# Patient Record
Sex: Female | Born: 1985 | Race: White | Hispanic: No | Marital: Single | Smoking: Current every day smoker
Health system: Southern US, Community
[De-identification: ages and names within clinical notes are randomized; demographics above are authoritative.]

## PROBLEM LIST (undated history)

## (undated) ENCOUNTER — Inpatient Hospital Stay (HOSPITAL_COMMUNITY): Payer: Self-pay

## (undated) DIAGNOSIS — F319 Bipolar disorder, unspecified: Secondary | ICD-10-CM

## (undated) DIAGNOSIS — F32A Depression, unspecified: Secondary | ICD-10-CM

## (undated) DIAGNOSIS — F329 Major depressive disorder, single episode, unspecified: Secondary | ICD-10-CM

## (undated) DIAGNOSIS — F1010 Alcohol abuse, uncomplicated: Secondary | ICD-10-CM

## (undated) DIAGNOSIS — F1910 Other psychoactive substance abuse, uncomplicated: Secondary | ICD-10-CM

## (undated) HISTORY — PX: OTHER SURGICAL HISTORY: SHX169

---
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
## 2006-04-22 ENCOUNTER — Emergency Department (HOSPITAL_COMMUNITY): Admission: EM | Admit: 2006-04-22 | Discharge: 2006-04-22 | Payer: Self-pay | Admitting: Emergency Medicine

## 2006-08-07 ENCOUNTER — Emergency Department (HOSPITAL_COMMUNITY): Admission: EM | Admit: 2006-08-07 | Discharge: 2006-08-07 | Payer: Self-pay | Admitting: Emergency Medicine

## 2006-10-02 ENCOUNTER — Emergency Department (HOSPITAL_COMMUNITY): Admission: EM | Admit: 2006-10-02 | Discharge: 2006-10-02 | Payer: Self-pay | Admitting: Emergency Medicine

## 2006-12-12 ENCOUNTER — Emergency Department (HOSPITAL_COMMUNITY): Admission: EM | Admit: 2006-12-12 | Discharge: 2006-12-13 | Payer: Self-pay | Admitting: Emergency Medicine

## 2006-12-27 ENCOUNTER — Emergency Department (HOSPITAL_COMMUNITY): Admission: EM | Admit: 2006-12-27 | Discharge: 2006-12-27 | Payer: Self-pay | Admitting: Emergency Medicine

## 2009-01-14 ENCOUNTER — Emergency Department (HOSPITAL_COMMUNITY): Admission: EM | Admit: 2009-01-14 | Discharge: 2009-01-14 | Payer: Self-pay | Admitting: Emergency Medicine

## 2009-04-21 ENCOUNTER — Emergency Department: Payer: Self-pay | Admitting: Emergency Medicine

## 2009-12-15 ENCOUNTER — Inpatient Hospital Stay (HOSPITAL_COMMUNITY): Admission: EM | Admit: 2009-12-15 | Discharge: 2009-12-16 | Payer: Self-pay | Admitting: Emergency Medicine

## 2010-01-29 ENCOUNTER — Emergency Department (HOSPITAL_COMMUNITY): Admission: EM | Admit: 2010-01-29 | Discharge: 2010-01-29 | Payer: Self-pay | Admitting: Emergency Medicine

## 2010-04-10 ENCOUNTER — Emergency Department (HOSPITAL_COMMUNITY): Admission: EM | Admit: 2010-04-10 | Discharge: 2010-02-10 | Payer: Self-pay | Admitting: Emergency Medicine

## 2010-06-26 ENCOUNTER — Emergency Department (HOSPITAL_COMMUNITY)
Admission: EM | Admit: 2010-06-26 | Discharge: 2010-06-26 | Disposition: A | Discharge status: Home / Self Care | Payer: Self-pay | Attending: Emergency Medicine | Admitting: Emergency Medicine

## 2010-06-26 DIAGNOSIS — K029 Dental caries, unspecified: Secondary | ICD-10-CM | POA: Insufficient documentation

## 2010-06-26 DIAGNOSIS — K089 Disorder of teeth and supporting structures, unspecified: Secondary | ICD-10-CM | POA: Insufficient documentation

## 2010-06-28 ENCOUNTER — Emergency Department (HOSPITAL_COMMUNITY)
Admission: EM | Admit: 2010-06-28 | Discharge: 2010-06-28 | Disposition: A | Discharge status: Home / Self Care | Payer: Self-pay | Attending: Emergency Medicine | Admitting: Emergency Medicine

## 2010-06-28 DIAGNOSIS — K089 Disorder of teeth and supporting structures, unspecified: Secondary | ICD-10-CM | POA: Insufficient documentation

## 2010-06-28 DIAGNOSIS — K029 Dental caries, unspecified: Secondary | ICD-10-CM | POA: Insufficient documentation

## 2010-07-17 LAB — CBC
MCH: 31.3 pg (ref 26.0–34.0)
MCHC: 34.8 g/dL (ref 30.0–36.0)
MCV: 89.9 fL (ref 78.0–100.0)
RBC: 4.83 MIL/uL (ref 3.87–5.11)
WBC: 16.3 10*3/uL — ABNORMAL HIGH (ref 4.0–10.5)

## 2010-07-17 LAB — BASIC METABOLIC PANEL
Chloride: 108 mEq/L (ref 96–112)
Creatinine, Ser: 0.83 mg/dL (ref 0.4–1.2)
Glucose, Bld: 86 mg/dL (ref 70–99)
Potassium: 4.4 mEq/L (ref 3.5–5.1)
Sodium: 140 mEq/L (ref 135–145)

## 2010-07-17 LAB — DIFFERENTIAL
Basophils Absolute: 0 10*3/uL (ref 0.0–0.1)
Lymphocytes Relative: 15 % (ref 12–46)
Monocytes Relative: 10 % (ref 3–12)
Neutro Abs: 12.1 10*3/uL — ABNORMAL HIGH (ref 1.7–7.7)
Neutrophils Relative %: 74 % (ref 43–77)

## 2010-07-18 LAB — POCT PREGNANCY, URINE: Preg Test, Ur: NEGATIVE

## 2010-07-18 LAB — DIFFERENTIAL
Basophils Absolute: 0.1 10*3/uL (ref 0.0–0.1)
Eosinophils Absolute: 0.2 10*3/uL (ref 0.0–0.7)
Lymphs Abs: 2 10*3/uL (ref 0.7–4.0)
Monocytes Absolute: 0.8 10*3/uL (ref 0.1–1.0)
Monocytes Relative: 7 % (ref 3–12)
Neutro Abs: 8.2 10*3/uL — ABNORMAL HIGH (ref 1.7–7.7)

## 2010-07-18 LAB — COMPREHENSIVE METABOLIC PANEL
AST: 15 U/L (ref 0–37)
Albumin: 3.5 g/dL (ref 3.5–5.2)
Albumin: 3.8 g/dL (ref 3.5–5.2)
Albumin: 4 g/dL (ref 3.5–5.2)
Alkaline Phosphatase: 49 U/L (ref 39–117)
BUN: 13 mg/dL (ref 6–23)
CO2: 26 mEq/L (ref 19–32)
Calcium: 8.6 mg/dL (ref 8.4–10.5)
Calcium: 8.8 mg/dL (ref 8.4–10.5)
Chloride: 107 mEq/L (ref 96–112)
Chloride: 111 mEq/L (ref 96–112)
Creatinine, Ser: 0.69 mg/dL (ref 0.4–1.2)
Creatinine, Ser: 0.71 mg/dL (ref 0.4–1.2)
GFR calc Af Amer: >60 mL/min (ref >60)
Glucose, Bld: 91 mg/dL (ref 70–99)
Total Bilirubin: 0.5 mg/dL (ref 0.3–1.2)
Total Bilirubin: 0.6 mg/dL (ref 0.3–1.2)
Total Protein: 6.5 g/dL (ref 6.0–8.3)

## 2010-07-18 LAB — RAPID URINE DRUG SCREEN, HOSP PERFORMED
Amphetamines: NOT DETECTED
Barbiturates: NOT DETECTED
Benzodiazepines: POSITIVE — AB
Opiates: NOT DETECTED

## 2010-07-18 LAB — CULTURE, BLOOD (ROUTINE X 2)
Culture: NO GROWTH
Culture: NO GROWTH

## 2010-07-18 LAB — CBC
HCT: 40.5 % (ref 36.0–46.0)
MCH: 30.9 pg (ref 26.0–34.0)
MCH: 31.5 pg (ref 26.0–34.0)
MCHC: 34.1 g/dL (ref 30.0–36.0)
MCHC: 35.3 g/dL (ref 30.0–36.0)
MCV: 90.6 fL (ref 78.0–100.0)
Platelets: 197 10*3/uL (ref 150–400)
RBC: 4.57 MIL/uL (ref 3.87–5.11)
RBC: 4.79 MIL/uL (ref 3.87–5.11)
RDW: 15 % (ref 11.5–15.5)

## 2010-07-18 LAB — PROTIME-INR
INR: 1.03 (ref 0.00–1.49)
INR: 1.08 (ref 0.00–1.49)
Prothrombin Time: 13.7 seconds (ref 11.6–15.2)

## 2010-07-18 LAB — BASIC METABOLIC PANEL
BUN: 13 mg/dL (ref 6–23)
GFR calc non Af Amer: >60 mL/min (ref >60)
Glucose, Bld: 78 mg/dL (ref 70–99)

## 2010-07-18 LAB — URINALYSIS, ROUTINE W REFLEX MICROSCOPIC
Bilirubin Urine: NEGATIVE
Glucose, UA: NEGATIVE mg/dL
Hgb urine dipstick: NEGATIVE
Ketones, ur: NEGATIVE mg/dL
Protein, ur: NEGATIVE mg/dL
Specific Gravity, Urine: 1.006 (ref 1.005–1.030)
Urobilinogen, UA: 0.2 mg/dL (ref 0.0–1.0)
pH: 5.5 (ref 5.0–8.0)

## 2010-07-18 LAB — URINE CULTURE
Colony Count: NO GROWTH
Culture: NO GROWTH

## 2010-07-18 LAB — SALICYLATE LEVEL: Salicylate Lvl: <4 mg/dL (ref 2.8–20.0)

## 2010-07-18 LAB — ACETAMINOPHEN LEVEL
Acetaminophen (Tylenol), Serum: 102.2 ug/mL — ABNORMAL HIGH (ref 10–30)
Acetaminophen (Tylenol), Serum: 23 ug/mL (ref 10–30)
Acetaminophen (Tylenol), Serum: <10 ug/mL — ABNORMAL LOW (ref 10–30)

## 2010-08-08 LAB — POCT URINALYSIS DIP (DEVICE)
Bilirubin Urine: NEGATIVE
Ketones, ur: NEGATIVE mg/dL
Nitrite: NEGATIVE
Protein, ur: NEGATIVE mg/dL
pH: 6 (ref 5.0–8.0)

## 2010-08-08 LAB — GC/CHLAMYDIA PROBE AMP, GENITAL: Chlamydia, DNA Probe: NEGATIVE

## 2010-12-20 ENCOUNTER — Emergency Department (HOSPITAL_COMMUNITY): Payer: Self-pay

## 2010-12-20 ENCOUNTER — Emergency Department (HOSPITAL_COMMUNITY)
Admission: EM | Admit: 2010-12-20 | Discharge: 2010-12-21 | Disposition: A | Discharge status: Home / Self Care | Payer: Self-pay | Attending: Emergency Medicine | Admitting: Emergency Medicine

## 2010-12-20 DIAGNOSIS — M545 Low back pain, unspecified: Secondary | ICD-10-CM | POA: Insufficient documentation

## 2010-12-20 DIAGNOSIS — M25569 Pain in unspecified knee: Secondary | ICD-10-CM | POA: Insufficient documentation

## 2010-12-20 DIAGNOSIS — R040 Epistaxis: Secondary | ICD-10-CM | POA: Insufficient documentation

## 2010-12-20 DIAGNOSIS — IMO0002 Reserved for concepts with insufficient information to code with codable children: Secondary | ICD-10-CM | POA: Insufficient documentation

## 2010-12-20 DIAGNOSIS — S0003XA Contusion of scalp, initial encounter: Secondary | ICD-10-CM | POA: Insufficient documentation

## 2010-12-20 DIAGNOSIS — R51 Headache: Secondary | ICD-10-CM | POA: Insufficient documentation

## 2011-02-16 LAB — DIFFERENTIAL
Basophils Absolute: 0
Basophils Relative: 0
Eosinophils Absolute: 0.1
Eosinophils Relative: 1
Lymphs Abs: 3.1
Neutrophils Relative %: 69

## 2011-02-16 LAB — CBC
HCT: 38.7
MCHC: 34.1
MCV: 88.1
Platelets: 191
RDW: 13.5

## 2011-02-16 LAB — BASIC METABOLIC PANEL
BUN: 2 — ABNORMAL LOW
Chloride: 110
Creatinine, Ser: 0.75
Glucose, Bld: 92
Potassium: 3.5

## 2011-02-16 LAB — RAPID URINE DRUG SCREEN, HOSP PERFORMED
Amphetamines: NOT DETECTED
Barbiturates: NOT DETECTED
Benzodiazepines: NOT DETECTED
Cocaine: NOT DETECTED

## 2012-02-17 ENCOUNTER — Emergency Department: Payer: Self-pay | Admitting: *Deleted

## 2012-07-11 ENCOUNTER — Emergency Department (HOSPITAL_COMMUNITY)
Admission: EM | Admit: 2012-07-11 | Discharge: 2012-07-11 | Disposition: A | Discharge status: Home / Self Care | Payer: Self-pay | Attending: Emergency Medicine | Admitting: Emergency Medicine

## 2012-07-11 ENCOUNTER — Encounter (HOSPITAL_COMMUNITY): Payer: Self-pay | Admitting: Emergency Medicine

## 2012-07-11 DIAGNOSIS — F17200 Nicotine dependence, unspecified, uncomplicated: Secondary | ICD-10-CM | POA: Insufficient documentation

## 2012-07-11 DIAGNOSIS — B86 Scabies: Secondary | ICD-10-CM

## 2012-07-11 MED ORDER — PERMETHRIN 5 % EX CREA: TOPICAL_CREAM | CUTANEOUS | Status: DC

## 2012-07-11 MED ORDER — HYDROXYZINE HCL 25 MG PO TABS: 25.0000 mg | ORAL_TABLET | Freq: Four times a day (QID) | ORAL | Status: DC

## 2012-07-11 NOTE — ED Notes (Signed)
MD at bedside. 

## 2012-07-11 NOTE — ED Provider Notes (Signed)
History    This chart was scribed for non-physician practitioner working with Joanna Co, MD by ED Scribe, Burman Nieves. This patient was seen in room WTR6/WTR6 and the patient's care was started at 3:42 PM.   CSN: [id number]  Arrival date & time 07/11/12  1523   First MD Initiated Contact with Patient 07/11/12 1542      Chief Complaint  Patient presents with  . Rash    (Consider location/radiation/quality/duration/timing/severity/associated sxs/prior treatment) Patient is a 27 y.o. female presenting with rash. The history is provided by the patient. No language interpreter was used.  Rash Location:  Full body Quality: itchiness   Severity:  Moderate Timing:  Constant Chronicity:  Recurrent Relieved by:  Nothing Associated symptoms: no fever, no throat swelling and no tongue swelling    Joanna Deleon is a 27 y.o. female who presents to the Emergency Department complaining of moderate constant irritation due to papular rash all over her body onset a month ago. Pt reports that the rash has a very itchy sensation all over her body. She says she had stayed in a hotel with her boyfriend where she may have picked up the rash from.  Pt is not having any trouble breathing or swallowing. Pt has tried anti-itch cream and Hydrocortisone with no relief. Boyfriend has rash as well. Pt denies fever, chills, cough, nausea, vomiting, diarrhea, SOB, weakness, and any other associated symptoms. Pt has no chronic medical hx.  She denies use of any new medications, lotions, soaps, or detergents.    History reviewed. No pertinent past surgical history.  No family history on file.  History  Substance Use Topics  . Smoking status: Current Every Day Smoker -- 1.00 packs/day    Types: Cigarettes  . Smokeless tobacco: Not on file  . Alcohol Use: No     Comment: rarely    OB History   Grav Para Term Preterm Abortions TAB SAB Ect Mult Living                  Review of Systems  Constitutional:  Negative for fever and chills.  Skin: Positive for rash (itchy rash).  All other systems reviewed and are negative.    Allergies  Review of patient's allergies indicates no known allergies.  Home Medications  No current outpatient prescriptions on file.  BP 113/61  Pulse 81  Temp(Src) 98.3 F (36.8 C) (Oral)  Resp 18  SpO2 98%  LMP 06/20/2012  Physical Exam  Nursing note and vitals reviewed. Constitutional: She appears well-developed and well-nourished.  HENT:  Head: Normocephalic and atraumatic.  Mouth/Throat: Oropharynx is clear and moist. No oropharyngeal exudate.  Eyes: EOM are normal. Pupils are equal, round, and reactive to light.  Neck: Normal range of motion. Neck supple.  Cardiovascular: Normal rate, regular rhythm and normal heart sounds.   Pulmonary/Chest: Effort normal and breath sounds normal. She has no wheezes.  Musculoskeletal: Normal range of motion.  Neurological: She is alert.  Skin: Skin is warm and dry. Rash noted. There is erythema.  Erythemas papular rash located on abdomen, back, webbed spaces of fingers , chest, forearms, elbows , and between pt's toes.  Psychiatric: She has a normal mood and affect. Her behavior is normal.    ED Course  Procedures (including critical care time) DIAGNOSTIC STUDIES: Oxygen Saturation is 98% on room air, normal by my interpretation.    COORDINATION OF CARE:  4:43 PM Discussed ED treatment with pt and pt agrees.  Labs Reviewed - No data to display No results found.   No diagnosis found.    MDM  Appearance and distribution of rash consistent with Scabies.  Patient given prescription for Permethrin Cream.    I personally performed the services described in this documentation, which was scribed in my presence. The recorded information has been reviewed and is accurate.    Pascal [place], PA-C 07/11/12 1807

## 2012-07-11 NOTE — ED Notes (Signed)
Pt presenting to ed with c/o rash to extremities x 1 month with itching

## 2012-07-12 NOTE — ED Provider Notes (Signed)
Medical screening examination/treatment/procedure(s) were performed by non-physician practitioner and as supervising physician I was immediately available for consultation/collaboration.   [name], MD 07/12/12 [phone]

## 2012-11-28 ENCOUNTER — Encounter (HOSPITAL_COMMUNITY): Payer: Self-pay | Admitting: Cardiology

## 2012-11-28 ENCOUNTER — Emergency Department (HOSPITAL_COMMUNITY)
Admission: EM | Admit: 2012-11-28 | Discharge: 2012-11-28 | Disposition: A | Discharge status: Home / Self Care | Payer: Self-pay | Attending: Emergency Medicine | Admitting: Emergency Medicine

## 2012-11-28 ENCOUNTER — Emergency Department (HOSPITAL_COMMUNITY): Payer: Self-pay

## 2012-11-28 DIAGNOSIS — F17200 Nicotine dependence, unspecified, uncomplicated: Secondary | ICD-10-CM | POA: Insufficient documentation

## 2012-11-28 DIAGNOSIS — R1013 Epigastric pain: Secondary | ICD-10-CM | POA: Insufficient documentation

## 2012-11-28 DIAGNOSIS — M545 Low back pain, unspecified: Secondary | ICD-10-CM | POA: Insufficient documentation

## 2012-11-28 DIAGNOSIS — R748 Abnormal levels of other serum enzymes: Secondary | ICD-10-CM | POA: Insufficient documentation

## 2012-11-28 DIAGNOSIS — Z3202 Encounter for pregnancy test, result negative: Secondary | ICD-10-CM | POA: Insufficient documentation

## 2012-11-28 DIAGNOSIS — R109 Unspecified abdominal pain: Secondary | ICD-10-CM

## 2012-11-28 DIAGNOSIS — R6883 Chills (without fever): Secondary | ICD-10-CM | POA: Insufficient documentation

## 2012-11-28 DIAGNOSIS — R112 Nausea with vomiting, unspecified: Secondary | ICD-10-CM | POA: Insufficient documentation

## 2012-11-28 LAB — LIPASE, BLOOD: Lipase: 26 U/L (ref 11–59)

## 2012-11-28 LAB — CBC WITH DIFFERENTIAL/PLATELET
Basophils Absolute: 0 10*3/uL (ref 0.0–0.1)
Basophils Relative: 0 % (ref 0–1)
Eosinophils Relative: 1 % (ref 0–5)
HCT: 44.6 % (ref 36.0–46.0)
Hemoglobin: 15.4 g/dL — ABNORMAL HIGH (ref 12.0–15.0)
MCH: 30.9 pg (ref 26.0–34.0)
MCHC: 34.5 g/dL (ref 30.0–36.0)
MCV: 89.4 fL (ref 78.0–100.0)
Monocytes Absolute: 1 10*3/uL (ref 0.1–1.0)
Monocytes Relative: 10 % (ref 3–12)
Neutro Abs: 6.8 10*3/uL (ref 1.7–7.7)
RDW: 13.8 % (ref 11.5–15.5)

## 2012-11-28 LAB — COMPREHENSIVE METABOLIC PANEL
Albumin: 3.7 g/dL (ref 3.5–5.2)
BUN: 13 mg/dL (ref 6–23)
CO2: 33 mEq/L — ABNORMAL HIGH (ref 19–32)
Calcium: 9.7 mg/dL (ref 8.4–10.5)
Chloride: 96 mEq/L (ref 96–112)
Creatinine, Ser: 0.82 mg/dL (ref 0.50–1.10)
GFR calc non Af Amer: >90 mL/min (ref >90)
Total Bilirubin: 0.9 mg/dL (ref 0.3–1.2)

## 2012-11-28 LAB — URINALYSIS, ROUTINE W REFLEX MICROSCOPIC
Ketones, ur: 15 mg/dL — AB
Nitrite: NEGATIVE
Protein, ur: 30 mg/dL — AB

## 2012-11-28 LAB — POCT PREGNANCY, URINE: Preg Test, Ur: NEGATIVE

## 2012-11-28 MED ORDER — SODIUM CHLORIDE 0.9 % IV BOLUS (SEPSIS)
1000.0000 mL | Freq: Once | INTRAVENOUS | Status: AC
  Administered 2012-11-28: 1000 mL via INTRAVENOUS

## 2012-11-28 MED ORDER — KETOROLAC TROMETHAMINE 30 MG/ML IJ SOLN
30.0000 mg | Freq: Once | INTRAMUSCULAR | Status: AC
  Administered 2012-11-28: 30 mg via INTRAVENOUS
  Filled 2012-11-28: qty 1

## 2012-11-28 MED ORDER — ONDANSETRON HCL 4 MG/2ML IJ SOLN
4.0000 mg | Freq: Once | INTRAMUSCULAR | Status: AC
  Administered 2012-11-28: 4 mg via INTRAVENOUS
  Filled 2012-11-28: qty 2

## 2012-11-28 MED ORDER — ONDANSETRON HCL 4 MG PO TABS: 4.0000 mg | ORAL_TABLET | Freq: Four times a day (QID) | ORAL | Status: DC

## 2012-11-28 MED ORDER — PANTOPRAZOLE SODIUM 40 MG IV SOLR
40.0000 mg | Freq: Once | INTRAVENOUS | Status: AC
  Administered 2012-11-28: 40 mg via INTRAVENOUS
  Filled 2012-11-28: qty 40

## 2012-11-28 NOTE — ED Provider Notes (Signed)
CSN: [id number]     Arrival date & time 11/28/12  1038 History     First MD Initiated Contact with Patient 11/28/12 1156     Chief Complaint  Patient presents with  . Abdominal Pain  . Emesis   (Consider location/radiation/quality/duration/timing/severity/associated sxs/prior Treatment) HPI Comments: 27 year old female no significant past medical history presents emergency department complaining of gradual onset midepigastric abdominal pain x2 days. Pain described as sharp, intermittent, occasionally radiating to her back. Pain episodes last about 5 minutes, nothing in specific makes him, though. She has not had any alleviating factors. Admits to associated cold chills, nausea and vomiting, vomiting worsening today when she began to vomit so much that she vomited a small amount of blood. Denies fever, increased urinary frequency, urgency or dysuria, vaginal or pelvic pain, vaginal discharge. She is currently on her menstrual cycle and is normal. Admits to eating a fried and fatty food from [employer] 2 days back, also had alcohol at that time. States she used to drink "a lot more than she does now". Current everyday smoker. No hx of abdominal surgeries.  Patient is a 27 y.o. female presenting with abdominal pain and vomiting. The history is provided by the patient.  Abdominal Pain Associated symptoms include abdominal pain, chills, nausea and vomiting. Pertinent negatives include no fever.  Emesis Associated symptoms: abdominal pain and chills   Associated symptoms: no diarrhea     History reviewed. No pertinent past medical history. History reviewed. No pertinent past surgical history. History reviewed. No pertinent family history. History  Substance Use Topics  . Smoking status: Current Every Day Smoker    Types: Cigarettes  . Smokeless tobacco: Not on file  . Alcohol Use: Yes   OB History   Grav Para Term Preterm Abortions TAB SAB Ect Mult Living                 Review of  Systems  Constitutional: Positive for chills and appetite change. Negative for fever.  Gastrointestinal: Positive for nausea, vomiting and abdominal pain. Negative for diarrhea.  Genitourinary: Negative for dysuria, flank pain, vaginal discharge, difficulty urinating, vaginal pain, menstrual problem and pelvic pain.  Musculoskeletal: Positive for back pain.  All other systems reviewed and are negative.    Allergies  Review of patient's allergies indicates no known allergies.  Home Medications  No current outpatient prescriptions on file. BP 115/62  Pulse 58  Temp(Src) 98 F (36.7 C) (Oral)  Resp 16  Ht 5\' 4"  (1.626 m)  Wt 122 lb (55.339 kg)  BMI 20.93 kg/m2  SpO2 97%  LMP 10/31/2012 Physical Exam  Nursing note and vitals reviewed. Constitutional: She is oriented to person, place, and time. She appears well-developed and well-nourished. No distress.  HENT:  Head: Normocephalic and atraumatic.  Mouth/Throat: Oropharynx is clear and moist.  Eyes: Conjunctivae are normal. No scleral icterus.  Neck: Normal range of motion. Neck supple.  Cardiovascular: Normal rate, regular rhythm and normal heart sounds.   Pulmonary/Chest: Effort normal and breath sounds normal.  Abdominal: Soft. Normal appearance and bowel sounds are normal. She exhibits no distension and no mass. There is tenderness in the epigastric area. There is no rigidity, no rebound, no guarding, no CVA tenderness, no tenderness at McBurney's point and negative Murphy's sign.  No peritoneal signs.  Musculoskeletal: Normal range of motion. She exhibits no edema.  Neurological: She is alert and oriented to person, place, and time.  Skin: Skin is warm and dry. She is not diaphoretic.  Psychiatric:  She has a normal mood and affect. Her behavior is normal.    ED Course   Procedures (including critical care time)  Labs Reviewed  CBC WITH DIFFERENTIAL - Abnormal; Notable for the following:    Hemoglobin 15.4 (*)    All  other components within normal limits  COMPREHENSIVE METABOLIC PANEL - Abnormal; Notable for the following:    CO2 33 (*)    Glucose, Bld 103 (*)    AST 161 (*)    ALT 249 (*)    Alkaline Phosphatase 134 (*)    All other components within normal limits  URINALYSIS, ROUTINE W REFLEX MICROSCOPIC - Abnormal; Notable for the following:    Color, Urine AMBER (*)    APPearance HAZY (*)    Hgb urine dipstick LARGE (*)    Bilirubin Urine SMALL (*)    Ketones, ur 15 (*)    Protein, ur 30 (*)    Leukocytes, UA SMALL (*)    All other components within normal limits  URINE MICROSCOPIC-ADD ON - Abnormal; Notable for the following:    Squamous Epithelial / LPF FEW (*)    Bacteria, UA FEW (*)    All other components within normal limits  URINE CULTURE  LIPASE, BLOOD  POCT PREGNANCY, URINE   US Abdomen Complete  11/28/2012   *RADIOLOGY REPORT*  Clinical Data:  Epigastric pain, elevated LFTs  COMPLETE ABDOMINAL ULTRASOUND  Comparison:  None.  Findings:  Gallbladder:  No gallstones, gallbladder wall thickening, or pericholecystic fluid. No sonographic Murphy's sign.  Common bile duct:  Measures 2.1 mm in diameter within normal limits.  Liver:  No focal lesion identified.  Within normal limits in parenchymal echogenicity.  IVC:  Appears normal.  Pancreas:  No focal abnormality seen.  Spleen:  Measures 9.3 cm in length.  Normal echogenicity.  Right Kidney:  Measures 10.7 cm in length.  No mass, hydronephrosis or diagnostic renal calculus  Left Kidney:  Measures 10.9 cm in length.  No mass, hydronephrosis or diagnostic renal calculus  Abdominal aorta:  No aneurysm identified. Measures up to 1.9 cm in diameter.  IMPRESSION: Negative abdominal ultrasound.   Original Report Authenticated By: Natasha Mead, M.D.   1. Abdominal pain   2. Elevated liver enzymes     MDM  Patient with mid-epigastric abdominal pain, n/v. She is afebrile, NAD, normal vital signs. Gastritis vs gallbladder disease vs pancreatitis.  Admits to drinking, poor diet. Labs, cbc, cmp, lipase, ua, urine preg. Fluids, zofran, toradol. 12:25 PM Elevated LFTs- AST 161, ALT 249, ask phos 134. No leukocytosis. Lipase normal. Will get abdominal ultrasound. 2:16 PM Negative abdominal US. Nausea has subsided. Still with mild abdominal pain, but is relating it to being hungry. Will give PO challenge. 2:41 PM Patient tolerated PO challenge. Abdominal pain has subsided, she is feeling better. Stable for discharge, f/u with GI, resources given for PCP. Advised against fatty/fried foods, alcohol. Rx for zofran given. Return precautions discussed. Patient states understanding of plan and is agreeable.   Trevor Mace, PA-C 11/28/12 1444

## 2012-11-28 NOTE — ED Notes (Signed)
NO answer x 2

## 2012-11-28 NOTE — ED Notes (Signed)
No answer x2 

## 2012-11-28 NOTE — ED Notes (Signed)
Pt reports that she started having abd pain and n/v that started 2 days ago. Reports that she also vomited blood this morning. Denies any urinary symptoms but denies report some lower back pain.

## 2012-11-28 NOTE — ED Provider Notes (Signed)
  Medical screening examination/treatment/procedure(s) were performed by non-physician practitioner and as supervising physician I was immediately available for consultation/collaboration.   Keymani Glynn, MD 11/28/12 1659 

## 2012-11-29 ENCOUNTER — Encounter (HOSPITAL_COMMUNITY): Payer: Self-pay | Admitting: Emergency Medicine

## 2012-11-29 LAB — URINE CULTURE: Colony Count: 65000

## 2012-12-04 ENCOUNTER — Encounter (HOSPITAL_COMMUNITY): Payer: Self-pay | Admitting: *Deleted

## 2012-12-04 ENCOUNTER — Emergency Department (HOSPITAL_COMMUNITY)
Admission: EM | Admit: 2012-12-04 | Discharge: 2012-12-04 | Disposition: A | Discharge status: Home / Self Care | Payer: Self-pay | Attending: Emergency Medicine | Admitting: Emergency Medicine

## 2012-12-04 DIAGNOSIS — Z008 Encounter for other general examination: Secondary | ICD-10-CM | POA: Insufficient documentation

## 2012-12-04 DIAGNOSIS — Z7289 Other problems related to lifestyle: Secondary | ICD-10-CM

## 2012-12-04 DIAGNOSIS — Y939 Activity, unspecified: Secondary | ICD-10-CM | POA: Insufficient documentation

## 2012-12-04 DIAGNOSIS — Z5900 Homelessness unspecified: Secondary | ICD-10-CM

## 2012-12-04 DIAGNOSIS — Y929 Unspecified place or not applicable: Secondary | ICD-10-CM | POA: Insufficient documentation

## 2012-12-04 DIAGNOSIS — F17200 Nicotine dependence, unspecified, uncomplicated: Secondary | ICD-10-CM | POA: Insufficient documentation

## 2012-12-04 DIAGNOSIS — Z8659 Personal history of other mental and behavioral disorders: Secondary | ICD-10-CM | POA: Insufficient documentation

## 2012-12-04 DIAGNOSIS — W268XXA Contact with other sharp object(s), not elsewhere classified, initial encounter: Secondary | ICD-10-CM | POA: Insufficient documentation

## 2012-12-04 DIAGNOSIS — F32A Depression, unspecified: Secondary | ICD-10-CM

## 2012-12-04 DIAGNOSIS — S61509A Unspecified open wound of unspecified wrist, initial encounter: Secondary | ICD-10-CM | POA: Insufficient documentation

## 2012-12-04 DIAGNOSIS — F329 Major depressive disorder, single episode, unspecified: Secondary | ICD-10-CM

## 2012-12-04 DIAGNOSIS — IMO0002 Reserved for concepts with insufficient information to code with codable children: Secondary | ICD-10-CM

## 2012-12-04 HISTORY — DX: Major depressive disorder, single episode, unspecified: F32.9

## 2012-12-04 HISTORY — DX: Depression, unspecified: F32.A

## 2012-12-04 HISTORY — DX: Bipolar disorder, unspecified: F31.9

## 2012-12-04 MED ORDER — TETANUS-DIPHTH-ACELL PERTUSSIS 5-2.5-18.5 LF-MCG/0.5 IM SUSP
0.5000 mL | Freq: Once | INTRAMUSCULAR | Status: AC
  Administered 2012-12-04: 0.5 mL via INTRAMUSCULAR
  Filled 2012-12-04: qty 0.5
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
## 2012-12-08 NOTE — ED Provider Notes (Signed)
Medical screening examination/treatment/procedure(s) were performed by non-physician practitioner and as supervising physician I was immediately available for consultation/collaboration.   Ross Bender, MD 12/08/12 1444 

## 2012-12-13 NOTE — ED Provider Notes (Signed)
Medical screening examination/treatment/procedure(s) were performed by non-physician practitioner and as supervising physician I was immediately available for consultation/collaboration.   Rolan Bucco, MD 12/13/12 2238

## 2013-02-23 ENCOUNTER — Inpatient Hospital Stay (HOSPITAL_COMMUNITY)
Admission: EM | Admit: 2013-02-23 | Discharge: 2013-02-26 | DRG: 897 | Disposition: A | Discharge status: Other Acute Inpatient Hospital | Payer: Federal, State, Local not specified - Other | Attending: Internal Medicine | Admitting: Internal Medicine

## 2013-02-23 ENCOUNTER — Encounter (HOSPITAL_COMMUNITY): Payer: Self-pay | Admitting: Emergency Medicine

## 2013-02-23 DIAGNOSIS — F1920 Other psychoactive substance dependence, uncomplicated: Secondary | ICD-10-CM

## 2013-02-23 DIAGNOSIS — IMO0002 Reserved for concepts with insufficient information to code with codable children: Secondary | ICD-10-CM | POA: Diagnosis present

## 2013-02-23 DIAGNOSIS — F17200 Nicotine dependence, unspecified, uncomplicated: Secondary | ICD-10-CM | POA: Diagnosis present

## 2013-02-23 DIAGNOSIS — F329 Major depressive disorder, single episode, unspecified: Secondary | ICD-10-CM | POA: Diagnosis present

## 2013-02-23 DIAGNOSIS — F319 Bipolar disorder, unspecified: Secondary | ICD-10-CM | POA: Diagnosis present

## 2013-02-23 DIAGNOSIS — F1910 Other psychoactive substance abuse, uncomplicated: Secondary | ICD-10-CM | POA: Diagnosis present

## 2013-02-23 DIAGNOSIS — F10939 Alcohol use, unspecified with withdrawal, unspecified: Principal | ICD-10-CM | POA: Diagnosis present

## 2013-02-23 DIAGNOSIS — F10239 Alcohol dependence with withdrawal, unspecified: Principal | ICD-10-CM | POA: Diagnosis present

## 2013-02-23 DIAGNOSIS — R000 Tachycardia, unspecified: Secondary | ICD-10-CM | POA: Diagnosis present

## 2013-02-23 DIAGNOSIS — F1010 Alcohol abuse, uncomplicated: Secondary | ICD-10-CM

## 2013-02-23 DIAGNOSIS — F1210 Cannabis abuse, uncomplicated: Secondary | ICD-10-CM

## 2013-02-23 DIAGNOSIS — F1410 Cocaine abuse, uncomplicated: Secondary | ICD-10-CM | POA: Diagnosis present

## 2013-02-23 DIAGNOSIS — F1020 Alcohol dependence, uncomplicated: Secondary | ICD-10-CM | POA: Diagnosis present

## 2013-02-23 DIAGNOSIS — D72829 Elevated white blood cell count, unspecified: Secondary | ICD-10-CM | POA: Diagnosis present

## 2013-02-23 DIAGNOSIS — F1510 Other stimulant abuse, uncomplicated: Secondary | ICD-10-CM

## 2013-02-23 DIAGNOSIS — R7881 Bacteremia: Secondary | ICD-10-CM

## 2013-02-23 LAB — CBC
Hemoglobin: 15.1 g/dL — ABNORMAL HIGH (ref 12.0–15.0)
MCH: 31.6 pg (ref 26.0–34.0)
MCHC: 34.8 g/dL (ref 30.0–36.0)
Platelets: 234 10*3/uL (ref 150–400)
RBC: 4.78 MIL/uL (ref 3.87–5.11)
RDW: 13.1 % (ref 11.5–15.5)
WBC: 12 10*3/uL — ABNORMAL HIGH (ref 4.0–10.5)

## 2013-02-23 LAB — URINE MICROSCOPIC-ADD ON

## 2013-02-23 LAB — URINALYSIS, ROUTINE W REFLEX MICROSCOPIC
Hgb urine dipstick: NEGATIVE
Ketones, ur: 15 mg/dL — AB
Leukocytes, UA: NEGATIVE
Protein, ur: 30 mg/dL — AB
Urobilinogen, UA: 1 mg/dL (ref 0.0–1.0)

## 2013-02-23 LAB — COMPREHENSIVE METABOLIC PANEL
ALT: 104 U/L — ABNORMAL HIGH (ref 0–35)
Albumin: 4.4 g/dL (ref 3.5–5.2)
Alkaline Phosphatase: 93 U/L (ref 39–117)
CO2: 28 mEq/L (ref 19–32)
Calcium: 10 mg/dL (ref 8.4–10.5)
Chloride: 96 mEq/L (ref 96–112)
Creatinine, Ser: 0.68 mg/dL (ref 0.50–1.10)
GFR calc Af Amer: >90 mL/min (ref >90)
GFR calc non Af Amer: >90 mL/min (ref >90)
Glucose, Bld: 111 mg/dL — ABNORMAL HIGH (ref 70–99)
Potassium: 4.3 mEq/L (ref 3.5–5.1)
Sodium: 136 mEq/L (ref 135–145)
Total Bilirubin: 0.5 mg/dL (ref 0.3–1.2)

## 2013-02-23 LAB — PREGNANCY, URINE: Preg Test, Ur: NEGATIVE

## 2013-02-23 LAB — RAPID URINE DRUG SCREEN, HOSP PERFORMED
Amphetamines: POSITIVE — AB
Barbiturates: NOT DETECTED
Benzodiazepines: NOT DETECTED
Cocaine: NOT DETECTED
Tetrahydrocannabinol: POSITIVE — AB

## 2013-02-23 LAB — TROPONIN I: Troponin I: <0.3 ng/mL (ref <0.30)

## 2013-02-23 LAB — HCG, QUANTITATIVE, PREGNANCY: hCG, Beta Chain, Quant, S: <1 m[IU]/mL (ref <5)

## 2013-02-23 LAB — ACETAMINOPHEN LEVEL: Acetaminophen (Tylenol), Serum: <15 ug/mL (ref 10–30)

## 2013-02-23 MED ORDER — FOLIC ACID 1 MG PO TABS
1.0000 mg | ORAL_TABLET | Freq: Every day | ORAL | Status: DC
  Administered 2013-02-24 – 2013-02-26 (×3): 1 mg via ORAL
  Filled 2013-02-23 (×3): qty 1

## 2013-02-23 MED ORDER — SODIUM CHLORIDE 0.9 % IV BOLUS (SEPSIS)
1000.0000 mL | Freq: Once | INTRAVENOUS | Status: AC
  Administered 2013-02-23: 1000 mL via INTRAVENOUS

## 2013-02-23 MED ORDER — ONDANSETRON HCL 4 MG/2ML IJ SOLN
4.0000 mg | Freq: Once | INTRAMUSCULAR | Status: AC
  Administered 2013-02-23: 4 mg via INTRAVENOUS
  Filled 2013-02-23: qty 2

## 2013-02-23 MED ORDER — ONDANSETRON HCL 4 MG PO TABS
4.0000 mg | ORAL_TABLET | Freq: Four times a day (QID) | ORAL | Status: DC | PRN
  Administered 2013-02-23: 4 mg via ORAL
  Filled 2013-02-23: qty 1

## 2013-02-23 MED ORDER — LORAZEPAM 1 MG PO TABS
1.0000 mg | ORAL_TABLET | Freq: Four times a day (QID) | ORAL | Status: DC | PRN
  Administered 2013-02-23 – 2013-02-25 (×6): 1 mg via ORAL
  Filled 2013-02-23 (×5): qty 1

## 2013-02-23 MED ORDER — ADULT MULTIVITAMIN W/MINERALS CH: 1.0000 | ORAL_TABLET | Freq: Every day | ORAL | Status: DC

## 2013-02-23 MED ORDER — LORAZEPAM 1 MG PO TABS: 2.0000 mg | ORAL_TABLET | ORAL | Status: DC | PRN

## 2013-02-23 MED ORDER — VITAMIN B-1 100 MG PO TABS
100.0000 mg | ORAL_TABLET | Freq: Every day | ORAL | Status: DC
  Administered 2013-02-24 – 2013-02-26 (×3): 100 mg via ORAL
  Filled 2013-02-23 (×4): qty 1

## 2013-02-23 MED ORDER — LORAZEPAM 2 MG/ML IJ SOLN
1.0000 mg | Freq: Once | INTRAMUSCULAR | Status: AC
  Administered 2013-02-23: 1 mg via INTRAVENOUS
  Filled 2013-02-23: qty 1

## 2013-02-23 MED ORDER — HEPARIN SODIUM (PORCINE) 5000 UNIT/ML IJ SOLN
5000.0000 [IU] | Freq: Three times a day (TID) | INTRAMUSCULAR | Status: DC
  Administered 2013-02-23 – 2013-02-26 (×8): 5000 [IU] via SUBCUTANEOUS
  Filled 2013-02-23 (×11): qty 1

## 2013-02-23 MED ORDER — SODIUM CHLORIDE 0.9 % IJ SOLN: 3.0000 mL | Freq: Two times a day (BID) | INTRAMUSCULAR | Status: DC

## 2013-02-23 MED ORDER — FOLIC ACID 1 MG PO TABS: 1.0000 mg | ORAL_TABLET | Freq: Every day | ORAL | Status: DC

## 2013-02-23 MED ORDER — LORAZEPAM 2 MG/ML IJ SOLN
2.0000 mg | Freq: Once | INTRAMUSCULAR | Status: AC
  Administered 2013-02-23: 2 mg via INTRAVENOUS
  Filled 2013-02-23: qty 1

## 2013-02-23 MED ORDER — SODIUM CHLORIDE 0.9 % IV SOLN: INTRAVENOUS | Status: DC

## 2013-02-23 MED ORDER — THIAMINE HCL 100 MG/ML IJ SOLN
Freq: Once | INTRAVENOUS | Status: AC
  Administered 2013-02-23: 21:00:00 via INTRAVENOUS
  Filled 2013-02-23: qty 1000

## 2013-02-23 MED ORDER — THIAMINE HCL 100 MG/ML IJ SOLN: 100.0000 mg | Freq: Every day | INTRAMUSCULAR | Status: DC

## 2013-02-23 MED ORDER — LORAZEPAM 1 MG PO TABS
1.0000 mg | ORAL_TABLET | Freq: Once | ORAL | Status: AC
  Administered 2013-02-23: 1 mg via ORAL
  Filled 2013-02-23: qty 1

## 2013-02-23 MED ORDER — ONDANSETRON HCL 4 MG/2ML IJ SOLN: 4.0000 mg | Freq: Four times a day (QID) | INTRAMUSCULAR | Status: DC | PRN

## 2013-02-23 MED ORDER — LORAZEPAM 2 MG/ML IJ SOLN
1.0000 mg | Freq: Four times a day (QID) | INTRAMUSCULAR | Status: DC | PRN
  Filled 2013-02-23: qty 1

## 2013-02-23 MED ORDER — VITAMIN B-1 100 MG PO TABS: 100.0000 mg | ORAL_TABLET | Freq: Every day | ORAL | Status: DC

## 2013-02-23 MED ORDER — CHLORHEXIDINE GLUCONATE CLOTH 2 % EX PADS
6.0000 | MEDICATED_PAD | Freq: Every day | CUTANEOUS | Status: DC
  Administered 2013-02-24 – 2013-02-26 (×3): 6 via TOPICAL

## 2013-02-23 MED ORDER — MUPIROCIN 2 % EX OINT
1.0000 "application " | TOPICAL_OINTMENT | Freq: Two times a day (BID) | CUTANEOUS | Status: DC
  Administered 2013-02-24 – 2013-02-26 (×6): 1 via NASAL
  Filled 2013-02-23 (×2): qty 22

## 2013-02-23 MED ORDER — THIAMINE HCL 100 MG/ML IJ SOLN
100.0000 mg | Freq: Every day | INTRAMUSCULAR | Status: DC
  Administered 2013-02-23: 100 mg via INTRAVENOUS
  Filled 2013-02-23 (×2): qty 1

## 2013-02-23 MED ORDER — ADULT MULTIVITAMIN W/MINERALS CH
1.0000 | ORAL_TABLET | Freq: Every day | ORAL | Status: DC
  Administered 2013-02-24 – 2013-02-26 (×3): 1 via ORAL
  Filled 2013-02-23 (×3): qty 1

## 2013-02-23 MED ORDER — NICOTINE 21 MG/24HR TD PT24
21.0000 mg | MEDICATED_PATCH | Freq: Once | TRANSDERMAL | Status: AC
  Administered 2013-02-23: 21 mg via TRANSDERMAL
  Filled 2013-02-23: qty 1

## 2013-02-23 MED ORDER — SODIUM CHLORIDE 0.9 % IV SOLN
INTRAVENOUS | Status: DC
  Administered 2013-02-23: 100 mL/h via INTRAVENOUS
  Administered 2013-02-24 – 2013-02-25 (×2): via INTRAVENOUS

## 2013-02-23 NOTE — ED Notes (Signed)
Report given to Magda Paganini, Charity fundraiser. Pt. Transferred to Pod C 27

## 2013-02-23 NOTE — Progress Notes (Signed)
Unit CM UR Completed by [facility] ED CM  W. Vaiden Adames RN  

## 2013-02-23 NOTE — Progress Notes (Signed)
Patient admitted from ED, Patient alert to self, high anxiety, very impulsive. VS stable, IVF started, will continue to monitor.

## 2013-02-23 NOTE — ED Notes (Signed)
PT reports hearing and seeing her friends walking in hall. Pt redirected to room and visitation  Policy explained to PT. Pt instructed that friends were not in hall.

## 2013-02-23 NOTE — ED Notes (Signed)
PT standing beside bed and refusing to return to bed.Security present to assist with returning PT to bed.

## 2013-02-23 NOTE — ED Notes (Signed)
Pt. oob to the bathroom, gait steady.  

## 2013-02-23 NOTE — ED Notes (Signed)
PT standing beside bed and refuses to return to bed. HR elevated.

## 2013-02-23 NOTE — ED Provider Notes (Signed)
TIME SEEN: 7:25 AM  CHIEF COMPLAINT: "I'm here for detox"  HPI: Pt is a 27 yo F with h/o depression, bipolar, ETOH abuse who presents to ED requesting detox.  Pt reports that she's been drinking heavily since she was 28 years old. She states she drinks a 12 pack of beer a day. Her longest period being sober has been 5 days. No history of complicated withdrawal, DTs, seizures, hallucinations. She reports her last drink was 3 days ago. She is having some shaking and tingling in her hands, nausea and vomiting, headache. States that she is here for detox. She reports she does smoke marijuana but no other drug use. Denies SI, HI, hallucinations, delusions.  ROS: See HPI Constitutional: no fever  Eyes: no drainage  ENT: no runny nose   Cardiovascular:  no chest pain  Resp: no SOB  GI: vomiting GU: no dysuria Integumentary: no rash  Allergy: no hives  Musculoskeletal: no leg swelling  Neurological: no slurred speech ROS otherwise negative  PAST MEDICAL HISTORY/PAST SURGICAL HISTORY:  Past Medical History  Diagnosis Date  . Bipolar 1 disorder   . Depression     MEDICATIONS:  Prior to Admission medications   Medication Sig Start Date End Date Taking? Authorizing Provider  ibuprofen (ADVIL,MOTRIN) 200 MG tablet Take 600 mg by mouth every 8 (eight) hours as needed for pain.     Historical Provider, MD    ALLERGIES:  No Known Allergies  SOCIAL HISTORY:  History  Substance Use Topics  . Smoking status: Current Every Day Smoker -- 1.00 packs/day    Types: Cigarettes  . Smokeless tobacco: Never Used  . Alcohol Use: Yes     Comment: rarely    FAMILY HISTORY: Father with a history of alcohol abuse  EXAM: LMP 01/19/2013 CONSTITUTIONAL: Alert and oriented and responds appropriately to questions. Well-appearing; well-nourished HEAD: Normocephalic EYES: Conjunctivae clear, PERRL ENT: normal nose; no rhinorrhea; moist mucous membranes; pharynx without lesions noted NECK: Supple, no  meningismus, no LAD  CARD: Tachycardic; S1 and S2 appreciated; no murmurs, no clicks, no rubs, no gallops RESP: Normal chest excursion without splinting or tachypnea; breath sounds clear and equal bilaterally; no wheezes, no rhonchi, no rales,  ABD/GI: Normal bowel sounds; non-distended; soft, non-tender, no rebound, no guarding BACK:  The back appears normal and is non-tender to palpation, there is no CVA tenderness EXT: Normal ROM in all joints; non-tender to palpation; no edema; normal capillary refill; no cyanosis    SKIN: Normal color for age and race; warm NEURO: Moves all extremities equally; normal gait, normal sensation diffusely, cranial nerves II through XII intact PSYCH: The patient's mood and manner are appropriate. Grooming and personal hygiene are appropriate. No suicidal or homicidal ideation. No delusions or hallucinations.  MEDICAL DECISION MAKING: Patient here with history of alcohol abuse requesting detox. She is currently 3 days out from her last drink. She is hemodynamically stable. Exam is unremarkable. No safety concerns. No history of complicated withdrawal. Will obtain basic labs, urine and discuss with behavioral health for detox.  ED PROGRESS: Patient's labs are unremarkable other than a slight leukocytosis which I suspect is reactive. No history of infectious symptoms. She has slight elevation in her AST and ALT consistent with alcohol abuse. UDS + for THC and amphetamines.  She is hemodynamically stable. Awaiting behavioral health consult for detox. Patient is here voluntarily.   11:00 AM  Pt continuing to be tachycardic. Will repeat IV Ativan.  She is able to tolerate by mouth.  3:00 PM  Pt with continued tachycardia. CIWA score 11.  She is restless, complaining of nausea.  Will continue Ativan, IVF.    3:30 PM  Pt now more agitated.  Pt with hallucinations, tremors, CIWA > 10.  Patient has received multiple rounds of oral and IV Ativan, 2 L IV fluids without  improvement. Patient requesting detox but is having complicated withdrawal and will need medical admission. Patient is too unstable at this point for behavioral health. She does not have a primary care physician. Will discuss with medicine team.  4:06 PM  Spoke with FM resident (page [phone]) who does not feel comfortable taking patient to step down. She would like medical care to see patient. Spoke with Dr. Molli Knock with critical care who will see the patient in the ED.  If they feel patient is not appropriate for ICU, they will discuss with family medicine team for admission.  Layla Maw Ward, DO 02/23/13 1620

## 2013-02-23 NOTE — H&P (Signed)
Date: 02/23/2013               Patient Name:  Joanna Deleon MRN: [id number]  DOB: 30-May-1985 Age / Sex: 27 y.o., female   PCP: No Pcp Per Patient         Medical Service: Internal Medicine Teaching Service         Attending Physician: Dr. Jonah Blue, DO    First Contact: Dr. Yetta Barre Pager: [phone]  Second Contact: Dr. Virgina Organ Pager: [phone]       After Hours (After 5p/  First Contact Pager: [phone]  weekends / holidays): Second Contact Pager: [phone]   Chief Complaint: Alcohol withdrawal  History of Present Illness:  [name] is a 27 y.o. female w/ PMHx of depression, bipolar disorder, alcohol and substance abuse, presents to the ED for alcohol withdrawal. The patient is a poor historian d/t possible substance abuse and withdrawal state. The patient claims she has not had any alcohol to drink for the past 2-3 days, when she drank 2 40 oz beers. She says she came to the ED because she has a serious alcohol problem and wants to quit. She admits to recent crack-cocaine abuse and marijuana use. Also admits to previous IVDU in the past, but not for several months, and only a couple of times. Patient does admit to being a "cutter" and had visible scarring on her arms from doing so. She claims she has not cut herself in some time and denies any suicidal or homicidal ideations. She also claims that when she does drink, she has at least a 12 pack. She denies any nausea, vomiting, abdominal pain, dizziness, lightheadedness, chest pain or SOB, recent visual, tactile, or auditory hallucinations. The patient was very agitated and tachycardic when seen in the ED. She was refusing to sit down and stay in her bed. Patient given a total of 5 mg Ativan in the ED. When interviewed, she also had slight slurring of speech.   The patient's recent history also discussed with her ex-boyfriend who claims she has been living on the street for the past several weeks and has been drinking excessively and  spending all of her money on crack-cocaine. He claims he has been with her for the past day and she drank alcohol last night (02/22/13). He and her "cousin" attempted to take her to a rehab facility but she refused and they then brought her to the ED. He also claims that she frequently cuts herself, most recently last week.   Meds: Current Facility-Administered Medications  Medication Dose Route Frequency Provider Last Rate Last Dose  . 0.9 %  sodium chloride infusion   Intravenous Continuous Kristen N Ward, DO      . folic acid (FOLVITE) tablet 1 mg  1 mg Oral Daily Darden Palmer, MD      . LORazepam (ATIVAN) tablet 1 mg  1 mg Oral Q6H PRN Darden Palmer, MD       Or  . LORazepam (ATIVAN) injection 1 mg  1 mg Intravenous Q6H PRN Darden Palmer, MD      . multivitamin with minerals tablet 1 tablet  1 tablet Oral Daily Darden Palmer, MD      . nicotine (NICODERM CQ - dosed in mg/24 hours) patch 21 mg  21 mg Transdermal Once Kristen N Ward, DO   21 mg at 02/23/13 1351  . thiamine (VITAMIN B-1) tablet 100 mg  100 mg Oral Daily Darden Palmer, MD  Or  . thiamine (B-1) injection 100 mg  100 mg Intravenous Daily Darden Palmer, MD        Allergies: Allergies as of 02/23/2013  . (No Known Allergies)   Past Medical History  Diagnosis Date  . Bipolar 1 disorder   . Depression    History reviewed. No pertinent past surgical history. No family history on file. History   Social History  . Marital Status: Married    Spouse Name: N/A    Number of Children: N/A  . Years of Education: N/A   Occupational History  . Not on file.   Social History Main Topics  . Smoking status: Current Every Day Smoker -- 1.00 packs/day    Types: Cigarettes  . Smokeless tobacco: Never Used  . Alcohol Use: Yes     Comment: rarely  . Drug Use: No  . Sexual Activity: Not on file   Other Topics Concern  . Not on file   Social History Narrative   ** Merged History Encounter **        Review of  Systems: General: Positive for fatigue. Denies fever, chills, diaphoresis, appetite change.  Respiratory: Denies SOB, DOE, cough, chest tightness, and wheezing.   Cardiovascular: Denies chest pain, palpitations and leg swelling.  Gastrointestinal: Denies nausea, vomiting, abdominal pain, diarrhea, constipation, blood in stool and abdominal distention.  Genitourinary: Denies dysuria, urgency, frequency, hematuria, flank pain and difficulty urinating.  Endocrine: Denies hot or cold intolerance, sweats, polyuria, polydipsia. Musculoskeletal: Denies myalgias, back pain, joint swelling, arthralgias and gait problem.  Skin: Positive for cutting wounds on bilateral forearms. Denies pallor, rash and wounds.  Neurological: Denies dizziness, seizures, syncope, weakness, lightheadedness, numbness and headaches.  Psychiatric/Behavioral: Positive for mood changes, confusion, nervousness, sleep disturbance and agitation.  Physical Exam: Filed Vitals:   02/23/13 1730 02/23/13 1736 02/23/13 1800 02/23/13 1900  BP: 124/98  117/96 133/88  Pulse: 112 127 105   Temp:    98.1 F (36.7 C)  TempSrc:    Oral  Resp: 25 19 13    SpO2: 100% 100% 98%    General: Vital signs reviewed.  Patient is a well-developed and well-nourished, in acute distress/agitated and somewhat cooperative with exam. Appears intoxicated. Alert and oriented x2.  Head: Normocephalic and atraumatic. Eyes: PERRL, EOMI, conjunctivae normal, No scleral icterus.  Neck: Supple, trachea midline, normal ROM, no JVD, masses, thyromegaly, or carotid bruit present.  Cardiovascular: Tachycardia, S1 normal, S2 normal, no murmurs, gallops, or rubs. Pulmonary/Chest: Normal respiratory effort, CTAB, no wheezes, rales, or rhonchi. Abdominal: Soft, mild supra-pubic tenderness, non-distended, bowel sounds are normal, no masses, organomegaly, or guarding present.  Musculoskeletal: No joint deformities, erythema, or stiffness, ROM full and no  nontender. Extremities: No swelling or edema,  pulses symmetric and intact bilaterally. No cyanosis or clubbing. Neurological: A&O x2, Strength is normal and symmetric bilaterally, cranial nerve II-XII are grossly intact, no focal motor deficit, sensory intact to light touch bilaterally.  Skin: Warm, dry and intact. No rashes or erythema. Old cutting scars on her forearms bilaterally. Psychiatric: Patient significantly agitated on exam. Tangential thinking, slurred speech, nervousness, appears intoxicated. Admits to depression, poor sleep, decreased energy. Denies suicidal/homicidal ideation despite very recent cutting history.  Lab results: Basic Metabolic Panel:  Recent Labs  16/10/96 0750  NA 136  K 4.3  CL 96  CO2 28  GLUCOSE 111*  BUN 15  CREATININE 0.68  CALCIUM 10.0   Liver Function Tests:  Recent Labs  02/23/13 0750  AST 92*  ALT  104*  ALKPHOS 93  BILITOT 0.5  PROT 8.6*  ALBUMIN 4.4   CBC:  Recent Labs  02/23/13 0750  WBC 12.0*  HGB 15.1*  HCT 43.4  MCV 90.8  PLT 234   Urine Drug Screen: Drugs of Abuse     Component Value Date/Time   LABOPIA NONE DETECTED 02/23/2013 0815   COCAINSCRNUR NONE DETECTED 02/23/2013 0815   LABBENZ NONE DETECTED 02/23/2013 0815   AMPHETMU POSITIVE* 02/23/2013 0815   THCU POSITIVE* 02/23/2013 0815   LABBARB NONE DETECTED 02/23/2013 0815    Alcohol Level:  Recent Labs  02/23/13 0750  ETH <11   Urinalysis:  Recent Labs  02/23/13 0815  COLORURINE AMBER*  LABSPEC 1.030  PHURINE 5.5  GLUCOSEU NEGATIVE  HGBUR NEGATIVE  BILIRUBINUR SMALL*  KETONESUR 15*  PROTEINUR 30*  UROBILINOGEN 1.0  NITRITE NEGATIVE  LEUKOCYTESUR NEGATIVE   Assessment & Plan by Problem: Ms. FLORA PARKS is a 27 y.o. female w/ PMHx of depression, bipolar disorder, alcohol and substance abuse, presents to the ED for alcohol withdrawal.  Alcohol withdrawal- Patient with history of alcohol and substance abuse, has not had a drink of alcohol  for at least 24 hours. Claims to drink >12 pack per day. On admission, EtOH <11. UDS +ve for methamphetamines and THC. Patient denies any visual, tactile, or auditory hallucinations, however, patient is extremely agitated, w/ slurring of speech, confusion, and tangential thinking. No N/V, abdominal pain, seizures, or LOC. Tachycardic on exam. -Admitted to stepdown -Placed on CIWA protocol. Received 5 mg Ativan in ED. Ativan 1 mg q6h prn agitation + Thiamine + Folate -Given 1 L banana bag @ 125 ml/hr followed by NS @ 100 ml/hr. -AST/ALT elevated to 92/104. Otherwise CMET wnl -EKG pending -Fall precautions -Seizure precautions -Neuro checks q2h  Substance Abuse- Pateint w/ h/o multiple substance abuse. Most recently used crack cocaine but also admits to IVDU in the past. UDS +ve for methamphetamines and THC. -Troponin -ve x 1 -HIV Ab pending -Blood cultures pending -Consult placed for SW  Leukocytosis- Admitted to the ED with WBC's of 12.0. Most likely reactive at this time, however other causes of elevated WBC's must be ruled out. Patient afebrile on admission. -UA shows 15 ketones, 30 protein many squames, few bacteria. Urine cultures pending -Blood cultures drawn, pending results. -U-preg -ve -HIV Ab pending  H/o cutting w/ possible suicidal ideations- Denies recent suicidal ideations.  -Place on suicide precautions for now.  -Salicylate/Acetaminophen levels -ve -Consult placed for SW  Dispo: Disposition is deferred at this time, awaiting improvement of current medical problems. Anticipated discharge in approximately 2-3 day(s).   The patient does not have a current PCP (No Pcp Per Patient) and does need an [facility] hospital follow-up appointment after discharge.  The patient does not have transportation limitations that hinder transportation to clinic appointments.  Signed: Courtney Paris, MD 02/23/2013, 6:53 PM  Pager: [phone]

## 2013-02-23 NOTE — ED Notes (Signed)
Pt. Here for detox. Drinks approx 12 pack a day. Last drink 3 days ago. Reports nausea, vomiting, tremors, tingling in hands. Denies SI/HI or hallucinations.

## 2013-02-23 NOTE — ED Notes (Signed)
CIWA- score due at 2030

## 2013-02-23 NOTE — ED Notes (Signed)
PT requesting detox from Alcohol . Pt A/O on arrival to room.

## 2013-02-23 NOTE — ED Notes (Addendum)
PT standing beside bed and stated " I did not know what to expect. I had so many other options."  Pt was not able to verbalize what her other options were.

## 2013-02-23 NOTE — BH Assessment (Addendum)
Assessment Note  Patient is a 27 year old female with a history of substance abuse and depression and bipolar disorder.   Patient request detox from alcohol and cocaine.  Patient reports that she has been drinking heavily since she was 27 years old. Patient reports she drinks a 12 pack of beer a day. Her longest period being sober has been 5 days. Patient reports withdrawal symptoms.  Patient report black outs and she does not remember how much she drank our used yesterday.  Patient UDs is positive for amphetamines and marijuana. Patient BAL is <11. Patient reports some shaking and tingling in her hands, nausea and vomiting, headache.   Patient reports SI without a plan.  Patient denies HI.  Patient denies psychosis Patient reports cutting herself in the past when she feels angry and out of control.  Patient reports that the last time she cut herself was a couple of days ago.  Patient reports that she does not remember the first time that she began to cut herself.  Patient denies any medication management or any other type of outpatient therapy.  Patient reports a prior suicide attempt in the past.    Axis I: Major Depression, Recurrent severe Axis II: Deferred Axis III:  Past Medical History  Diagnosis Date  . Bipolar 1 disorder   . Depression    Axis IV: economic problems, educational problems, housing problems, occupational problems, other psychosocial or environmental problems, problems related to legal system/crime, problems related to social environment and problems with primary support group Axis V: 31-40 impairment in reality testing  Past Medical History:  Past Medical History  Diagnosis Date  . Bipolar 1 disorder   . Depression     History reviewed. No pertinent past surgical history.  Family History: No family history on file.  Social History:  reports that she has been smoking Cigarettes.  She has been smoking about 1.00 pack per day. She has never used smokeless tobacco.  She reports that she drinks alcohol. She reports that she does not use illicit drugs.  Additional Social History:     CIWA: CIWA-Ar BP: 114/78 mmHg Pulse Rate: 143 Nausea and Vomiting: mild nausea with no vomiting Tactile Disturbances: none Tremor: not visible, but can be felt fingertip to fingertip Auditory Disturbances: not present Paroxysmal Sweats: no sweat visible Visual Disturbances: not present Anxiety: two Headache, Fullness in Head: very mild Agitation: normal activity Orientation and Clouding of Sensorium: oriented and can do serial additions CIWA-Ar Total: 5 COWS:    Allergies: No Known Allergies  Home Medications:  (Not in a hospital admission)  OB/GYN Status:  Patient's last menstrual period was 01/19/2013.  General Assessment Data Location of Assessment: [facility] Services Is this a Tele or Face-to-Face Assessment?: Tele Assessment Is this an Initial Assessment or a Re-assessment for this encounter?: Initial Assessment Living Arrangements: Alone;Other (Comment) Can pt return to current living arrangement?: Yes Admission Status: Voluntary Is patient capable of signing voluntary admission?: Yes Transfer from: Acute Hospital Referral Source: Self/Family/Friend  Medical Screening Exam [facility] Walk-in ONLY) Medical Exam completed: Yes  [facility] Crisis Care Plan Living Arrangements: Alone;Other (Comment)  Education Status Is patient currently in school?: No  Risk to self Suicidal Ideation: Yes-Currently Present Suicidal Intent: No-Not Currently/Within Last 6 Months Is patient at risk for suicide?: Yes Suicidal Plan?: No-Not Currently/Within Last 6 Months Access to Means: Yes Specify Access to Suicidal Means: cutting her wrist or overdose What has been your use of drugs/alcohol within the last 12 months?:  alcohol, cocaine Previous Attempts/Gestures: Yes How many times?: 1 Other Self Harm Risks: cutting  Triggers for Past Attempts:  Unpredictable Intentional Self Injurious Behavior: Cutting Comment - Self Injurious Behavior: cutting Family Suicide History: No Recent stressful life event(s): Job Loss;Financial Problems;Legal Issues;Other (Comment);Trauma (Comment) Persecutory voices/beliefs?: No Depression: Yes Depression Symptoms: Insomnia;Isolating;Fatigue;Loss of interest in usual pleasures;Feeling angry/irritable Substance abuse history and/or treatment for substance abuse?: Yes Suicide prevention information given to non-admitted patients: Yes  Risk to Others Homicidal Ideation: No Thoughts of Harm to Others: No Current Homicidal Intent: No Current Homicidal Plan: No Access to Homicidal Means: No Identified Victim: None History of harm to others?: No Assessment of Violence: None Noted Violent Behavior Description: calm Does patient have access to weapons?: Yes (Comment) Criminal Charges Pending?: No Does patient have a court date: No (Patient is on probation for 2 years)  Psychosis Hallucinations: None noted Delusions: None noted  Mental Status Report Appear/Hygiene: Disheveled Eye Contact: Fair Motor Activity: Freedom of movement Speech: Logical/coherent Level of Consciousness: Quiet/awake Mood: Depressed;Anxious Affect: Anxious;Irritable Anxiety Level: None Thought Processes: Coherent;Relevant Judgement: Unimpaired Orientation: Person;Place;Time;Situation Obsessive Compulsive Thoughts/Behaviors: None  Cognitive Functioning Concentration: Decreased Memory: Recent Intact;Remote Intact IQ: Average Insight: Fair Impulse Control: Poor Appetite: Fair Weight Loss: 0 Weight Gain: 0 Sleep: Decreased Total Hours of Sleep: 3 Vegetative Symptoms: Not bathing  ADLScreening [facility] Assessment Services) Patient's cognitive ability adequate to safely complete daily activities?: Yes Patient able to express need for assistance with ADLs?: Yes Independently performs ADLs?: Yes (appropriate for  developmental age)  Prior Inpatient Therapy Prior Inpatient Therapy: Yes Prior Therapy Dates: 2011 Prior Therapy Facilty/Provider(s): [facility] Reason for Treatment: Detox  Prior Outpatient Therapy Prior Outpatient Therapy: No Prior Therapy Dates: na Prior Therapy Facilty/Provider(s): na Reason for Treatment: na  ADL Screening (condition at time of admission) Patient's cognitive ability adequate to safely complete daily activities?: Yes Patient able to express need for assistance with ADLs?: Yes Independently performs ADLs?: Yes (appropriate for developmental age)         Values / Beliefs Cultural Requests During Hospitalization: None Spiritual Requests During Hospitalization: None        Additional Information 1:1 In Past 12 Months?: No     Disposition:  Disposition Initial Assessment Completed for this Encounter: Yes Disposition of Patient: Other dispositions Other disposition(s): Other (Comment)  On Site Evaluation by:   Reviewed with Physician:    Phillip Heal LaVerne 02/23/2013 12:52 PM

## 2013-02-23 NOTE — ED Notes (Signed)
Telepsych completed.  

## 2013-02-23 NOTE — ED Notes (Signed)
Pt walking in hall ,restless and is requesting  nicotien patch . EDP WARD notified Of PT request.

## 2013-02-23 NOTE — ED Notes (Signed)
Pt. oob to the bathroom, gait steady , Pt. Is eating breakfast .

## 2013-02-24 DIAGNOSIS — F1910 Other psychoactive substance abuse, uncomplicated: Secondary | ICD-10-CM

## 2013-02-24 DIAGNOSIS — F3160 Bipolar disorder, current episode mixed, unspecified: Secondary | ICD-10-CM

## 2013-02-24 LAB — URINE CULTURE
Colony Count: NO GROWTH
Culture: NO GROWTH

## 2013-02-24 LAB — COMPREHENSIVE METABOLIC PANEL
ALT: 66 U/L — ABNORMAL HIGH (ref 0–35)
AST: 60 U/L — ABNORMAL HIGH (ref 0–37)
Alkaline Phosphatase: 66 U/L (ref 39–117)
BUN: 9 mg/dL (ref 6–23)
CO2: 24 mEq/L (ref 19–32)
Chloride: 104 mEq/L (ref 96–112)
Creatinine, Ser: 0.83 mg/dL (ref 0.50–1.10)
GFR calc Af Amer: >90 mL/min (ref >90)
Glucose, Bld: 124 mg/dL — ABNORMAL HIGH (ref 70–99)
Potassium: 3.5 mEq/L (ref 3.5–5.1)
Sodium: 138 mEq/L (ref 135–145)
Total Bilirubin: 0.5 mg/dL (ref 0.3–1.2)
Total Protein: 5.9 g/dL — ABNORMAL LOW (ref 6.0–8.3)

## 2013-02-24 LAB — CBC
HCT: 36.4 % (ref 36.0–46.0)
MCHC: 34.6 g/dL (ref 30.0–36.0)
RDW: 12.9 % (ref 11.5–15.5)
WBC: 7.5 10*3/uL (ref 4.0–10.5)

## 2013-02-24 LAB — HIV ANTIBODY (ROUTINE TESTING W REFLEX): HIV: NONREACTIVE

## 2013-02-24 LAB — TROPONIN I: Troponin I: <0.3 ng/mL (ref <0.30)

## 2013-02-24 MED ORDER — ACETAMINOPHEN 325 MG PO TABS
325.0000 mg | ORAL_TABLET | Freq: Four times a day (QID) | ORAL | Status: DC | PRN
  Administered 2013-02-24: 325 mg via ORAL
  Filled 2013-02-24: qty 1

## 2013-02-24 MED ORDER — INFLUENZA VAC SPLIT QUAD 0.5 ML IM SUSP
0.5000 mL | INTRAMUSCULAR | Status: AC
  Administered 2013-02-25: 0.5 mL via INTRAMUSCULAR
  Filled 2013-02-24: qty 0.5

## 2013-02-24 NOTE — H&P (Signed)
INTERNAL MEDICINE TEACHING SERVICE Attending Admission Note  Date: 02/24/2013  Patient name: MARGE VANDERMEULEN  Medical record number: [id number]  Date of birth: Dec 01, 1985    I have seen and evaluated Marrian Salvage and discussed their care with the Residency Team.  27 year old female with past medical history significant for polysubstance abuse, including cocaine and alcohol, presented with s/s consistent with alcohol withdrawal. She is improved clinically today. Alert, awake, and fully oriented. Would continue CIWA protocol. Leukocytosis likely reactive in nature. I see no evidence of infection.  Consult psychiatry. Move to regular floor, no need to remain in [facility].  Jonah Blue, DO, FACP Faculty [facility] Internal Medicine Residency Program 02/24/2013, 12:02 PM

## 2013-02-24 NOTE — Progress Notes (Signed)
Patient being transferred to 5 W per MD order. Report called to Ginger, RN. Suicide sitter will go with pt to new unit. All VSS .

## 2013-02-24 NOTE — Progress Notes (Signed)
Patient transferred from 3S 13 on Suicide precautions spoke to Dr. Yetta Barre and he said he would discontinue. Patient does not want to harm herself in any way. Skin intact some  Marks on forearms from cutting she is a cutter. Vitals signs stable on Tele 10. Will monitor.

## 2013-02-24 NOTE — Progress Notes (Signed)
Subjective: [name] is a 27 y.o. female w/ PMHx of depression, bipolar disorder, alcohol and substance abuse, presents to the ED for alcohol withdrawal.  Patient seen at bedside this AM. Patient is much improved, no agitation, sleeping calmly. Visited [name] again in the afternoon to find her pleasantly sitting on the edge of her bed, cooperative and able to answer questions appropriately. Only minimal tremulousness on exam. No complaints at this time except for a mild headache. Denies any fever, chills, SOB, dizziness, lightheadedness. Also denies visual, auditory, or tactile hallucinations. Denies any suicidal ideations at this time.   Objective: Vital signs in last 24 hours: Filed Vitals:   02/24/13 1126 02/24/13 1500 02/24/13 1505 02/24/13 1656  BP: 107/73 81/64 120/70 124/68  Pulse: 87 93    Temp: 98.1 F (36.7 C) 97.5 F (36.4 C)    TempSrc: Oral Oral    Resp: 18 16    SpO2: 97% 100%     Weight change:   Intake/Output Summary (Last 24 hours) at 02/24/13 1756 Last data filed at 02/24/13 1400  Gross per 24 hour  Intake   3480 ml  Output    900 ml  Net   2580 ml   Physical Exam: General: Alert, cooperative, and in no apparent distress HEENT: Vision grossly intact, oropharynx clear and non-erythematous  Neck: Full range of motion without pain, supple, no lymphadenopathy or carotid bruits Lungs: Clear to ascultation bilaterally, normal work of respiration, no wheezes, rales, ronchi Heart: Slight tachycardia, normal rhythm, no murmurs, gallops, or rubs Abdomen: Soft, non-tender, non-distended, normal bowel sounds Extremities: No cyanosis, clubbing, or edema Neurologic: Alert & oriented X3, cranial nerves II-XII intact, strength grossly intact, sensation intact to light touch  Lab Results: Basic Metabolic Panel:  Recent Labs Lab 02/23/13 0750 02/24/13 0500  NA 136 138  K 4.3 3.5  CL 96 104  CO2 28 24  GLUCOSE 111* 124*  BUN 15 9  CREATININE 0.68 0.83    CALCIUM 10.0 8.4   Liver Function Tests:  Recent Labs Lab 02/23/13 0750 02/24/13 0500  AST 92* 60*  ALT 104* 66*  ALKPHOS 93 66  BILITOT 0.5 0.5  PROT 8.6* 5.9*  ALBUMIN 4.4 3.0*   CBC:  Recent Labs Lab 02/23/13 0750 02/24/13 0500  WBC 12.0* 7.5  HGB 15.1* 12.6  HCT 43.4 36.4  MCV 90.8 91.0  PLT 234 170   Cardiac Enzymes:  Recent Labs Lab 02/23/13 1826 02/23/13 2311 02/24/13 0500  TROPONINI <0.30 <0.30 <0.30   Urine Drug Screen: Drugs of Abuse     Component Value Date/Time   LABOPIA NONE DETECTED 02/23/2013 0815   COCAINSCRNUR NONE DETECTED 02/23/2013 0815   LABBENZ NONE DETECTED 02/23/2013 0815   AMPHETMU POSITIVE* 02/23/2013 0815   THCU POSITIVE* 02/23/2013 0815   LABBARB NONE DETECTED 02/23/2013 0815    Alcohol Level:  Recent Labs Lab 02/23/13 0750  ETH <11   Urinalysis:  Recent Labs Lab 02/23/13 0815  COLORURINE AMBER*  LABSPEC 1.030  PHURINE 5.5  GLUCOSEU NEGATIVE  HGBUR NEGATIVE  BILIRUBINUR SMALL*  KETONESUR 15*  PROTEINUR 30*  UROBILINOGEN 1.0  NITRITE NEGATIVE  LEUKOCYTESUR NEGATIVE    Micro Results: Recent Results (from the past 240 hour(s))  URINE CULTURE     Status: None   Collection Time    02/23/13  8:15 AM      Result Value Range Status   Specimen Description URINE, CLEAN CATCH   Final   Special Requests NONE  Final   Culture  Setup Time     Final   Value: 02/23/2013 09:36     Performed at [facility]   Colony Count     Final   Value: NO GROWTH     Performed at [facility]   Culture     Final   Value: NO GROWTH     Performed at [facility]   Report Status 02/24/2013 FINAL   Final  MRSA PCR SCREENING     Status: Abnormal   Collection Time    02/23/13  6:57 PM      Result Value Range Status   MRSA by PCR POSITIVE (*) NEGATIVE Corrected   Comment: RESULT CALLED TO, READ BACK BY AND VERIFIED WITH:     L HITT,RN [phone] WILDER     CORRECTED RESULTS CALLED TO:     D FIELDS,RN  AT [phone] [id number] BY K BARR                The GeneXpert MRSA Assay (FDA     approved for NASAL specimens     only), is one component of a     comprehensive MRSA colonization     surveillance program. It is not     intended to diagnose MRSA     infection nor to guide or     monitor treatment for     MRSA infections.     CORRECTED ON 10/24 AT 0901: PREVIOUSLY REPORTED AS RESULT CALLED TO, READ BACK BY AND VERIFIED WITH: LHITT,RN [phone] WILDER        The GeneXpert MRSA Assay (FDA approved for NASAL specimens only), is one component of a comprehensive MRSA      colonization surveillance program. It is not intended to diagnose MRSA infection nor to guide or monitor treatment for MRSA infections. POSITIVE   Studies/Results: No results found. Medications: I have reviewed the patient's current medications. Scheduled Meds: . Chlorhexidine Gluconate Cloth  6 each Topical Q0600  . folic acid  1 mg Oral Daily  . heparin  5,000 Units Subcutaneous Q8H  . [START ON 02/25/2013] influenza vac split quadrivalent PF  0.5 mL Intramuscular Tomorrow-1000  . multivitamin with minerals  1 tablet Oral Daily  . mupirocin ointment  1 application Nasal BID  . sodium chloride  3 mL Intravenous Q12H  . thiamine  100 mg Oral Daily   Continuous Infusions: . sodium chloride 75 mL/hr at 02/24/13 1706   PRN Meds:.acetaminophen, LORazepam, LORazepam, ondansetron (ZOFRAN) IV, ondansetron  Assessment/Plan: [name] is a 27 y.o. female w/ PMHx of depression, bipolar disorder, alcohol and substance abuse, presents to the ED for alcohol withdrawal.  Alcohol withdrawal- Patient with history of alcohol and substance abuse, has not had a drink of alcohol for at least 24 hours. Claims to drink >12 pack per day. On admission, EtOH <11. UDS +ve for methamphetamines and THC. Much improved today on exam. Still with mild tremulousness.  -Moved out of stepdown. -Continue w/ CIWA protocol. Ativan 1 mg q6h prn  agitation + Thiamine + Folate  -Continue NS @ 75 ml/hr.  -AST/ALT elevated to 92/104. Otherwise CMET wnl  -EKG shows NSR -Continue fall precautions  -Seizure precautions  -Neuro checks q4h   Substance Abuse- Pateint w/ h/o multiple substance abuse. Most recently used crack cocaine but also admits to IVDU in the past. UDS +ve for methamphetamines and THC.  -Troponin -ve x 1  -HIV Ab non-reactive -Blood cultures  pending  -Consult placed for SW  -Psychiatry consulted, suggest inpatient psych placement after discharge.    Leukocytosis- Admitted to the ED with [facility] of 12.0. Most likely reactive at this time, however other causes of elevated WBC's must be ruled out. Patient afebrile on admission.  -UA shows 15 ketones, 30 protein many squames, few bacteria. Urine cultures pending  -Blood cultures drawn, pending results.  -U-preg -ve  -HIV Ab non-reactive -Will repeat CBC in AM  Bipolar Disorder/Depression/"cutting" history- Denies suicidal intent at this time.  -Psych consult much appreciated. Recommends sitter present in room for safety. Also recommends inpatient Mental Health placement after patient is medically clear to manage bipolar disorder/depression. -Salicylate/Acetaminophen levels -ve in ED -Consult placed for SW  Dispo: Disposition is deferred at this time, awaiting improvement of current medical problems.  Anticipated discharge in approximately 1-2 day(s).   The patient does not have a current PCP (No Pcp Per Patient) and does need an [facility] hospital follow-up appointment after discharge.  The patient does not have transportation limitations that hinder transportation to clinic appointments.  .Services Needed at time of discharge: Y = Yes, Blank = No PT:   OT:   RN:   Equipment:   Other:     LOS: 1 day   Courtney Paris, MD 02/24/2013, 5:56 PM Pager: [phone]
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
## 2013-02-24 NOTE — Consult Note (Signed)
[facility] Face-to-Face Psychiatry Consult   Reason for Consult:  Etoh intoxication, poly substance dependence and suicidal thinking Referring Physician:  Dr Yetta Barre  Marrian Salvage is an 27 y.o. female.  Assessment: AXIS I:  Bipolar, mixed and Substance Abuse AXIS II:  Deferred AXIS III:   Past Medical History  Diagnosis Date  . Bipolar 1 disorder   . Depression    AXIS IV:  economic problems, other psychosocial or environmental problems, problems related to social environment and problems with primary support group AXIS V:  11-20 some danger of hurting self or others possible OR occasionally fails to maintain minimal personal hygiene OR gross impairment in communication  Plan:  Recommend psychiatric Inpatient admission when medically cleared.  Subjective:   TONEA LEIPHART is a 27 y.o. female patient admitted with a polysubstance dependence.  HPI:  Patient seen chart reviewed patient is 27 year old female who was admitted on the medical floor because of substance abuse and alcohol withdrawal.  She admitted drinking alcohol the past 2-3 days.  Initially she came to the emergency room for seeking treatment for her depression however found to be tachycardic and admitted on the medical floor.  Endorse at least drinking 12 packs of beer every day however her blood alcohol level was less than.  Her UDS is positive for amphetamine and marijuana.  Patient has history of cutting herself.  She also endorsed paranoia and sometimes believes people are calling her name.  She admitted cutting herself 2 days ago.  The patient currently not seeing any psychiatrist or not taking any medication.  She's been diagnosed with bipolar disorder in the past.  She's also on probation for 2 years because of credit card fraud and misdemeanor charges.  Patient admitted history of drugs and alcohol in the family.  Currently she is doing with the boyfriend.  Patient admitted severe mood swings anger poor sleep paranoia and suicidal  thoughts.  She denies any hallucination or something feel severe aggression.  Patient is requesting help. HPI Elements:   Location:  Medical floor. Quality:  poor. Severity:  Moderate.  Past Psychiatric History: Past Medical History  Diagnosis Date  . Bipolar 1 disorder   . Depression     reports that she has been smoking Cigarettes.  She has been smoking about 1.00 pack per day. She has never used smokeless tobacco. She reports that she drinks alcohol. She reports that she does not use illicit drugs. No family history on file. Family History Substance Abuse: No Family Supports: No Living Arrangements: Alone;Other (Comment) Can pt return to current living arrangement?: Yes   Allergies:  No Known Allergies  ACT Assessment Complete:  Yes:    Educational Status    Risk to Self: Risk to self Suicidal Ideation: Yes-Currently Present Suicidal Intent: No-Not Currently/Within Last 6 Months Is patient at risk for suicide?: Yes Suicidal Plan?: No-Not Currently/Within Last 6 Months Access to Means: Yes Specify Access to Suicidal Means: cutting her wrist or overdose What has been your use of drugs/alcohol within the last 12 months?: alcohol, cocaine Previous Attempts/Gestures: Yes How many times?: 1 Other Self Harm Risks: cutting  Triggers for Past Attempts: Unpredictable Intentional Self Injurious Behavior: Cutting Comment - Self Injurious Behavior: cutting Family Suicide History: No Recent stressful life event(s): Job Loss;Financial Problems;Legal Issues;Other (Comment);Trauma (Comment) Persecutory voices/beliefs?: No Depression: Yes Depression Symptoms: Insomnia;Isolating;Fatigue;Loss of interest in usual pleasures;Feeling angry/irritable Substance abuse history and/or treatment for substance abuse?: Yes Suicide prevention information given to non-admitted patients: Yes  Risk  to Others: Risk to Others Homicidal Ideation: No Thoughts of Harm to Others: No Current Homicidal  Intent: No Current Homicidal Plan: No Access to Homicidal Means: No Identified Victim: None History of harm to others?: No Assessment of Violence: None Noted Violent Behavior Description: calm Does patient have access to weapons?: Yes (Comment) Criminal Charges Pending?: No Does patient have a court date: No (Patient is on probation for 2 years)  Abuse:    Prior Inpatient Therapy: Prior Inpatient Therapy Prior Inpatient Therapy: Yes Prior Therapy Dates: 2011 Prior Therapy Facilty/Provider(s): [facility] Reason for Treatment: Detox  Prior Outpatient Therapy: Prior Outpatient Therapy Prior Outpatient Therapy: No Prior Therapy Dates: na Prior Therapy Facilty/Provider(s): na Reason for Treatment: na  Additional Information: Additional Information 1:1 In Past 12 Months?: No                  Objective: Blood pressure 81/64, pulse 93, temperature 97.5 F (36.4 C), temperature source Oral, resp. rate 16, last menstrual period 01/19/2013, SpO2 100.00%.There is no weight on file to calculate BMI. Results for orders placed during the hospital encounter of 02/23/13 (from the past 72 hour(s))  CBC     Status: Abnormal   Collection Time    02/23/13  7:50 AM      Result Value Range   WBC 12.0 (*) 4.0 - 10.5 K/uL   RBC 4.78  3.87 - 5.11 MIL/uL   Hemoglobin 15.1 (*) 12.0 - 15.0 g/dL   HCT 16.1  09.6 - 04.5 %   MCV 90.8  78.0 - 100.0 fL   MCH 31.6  26.0 - 34.0 pg   MCHC 34.8  30.0 - 36.0 g/dL   RDW 40.9  81.1 - 91.4 %   Platelets 234  150 - 400 K/uL  COMPREHENSIVE METABOLIC PANEL     Status: Abnormal   Collection Time    02/23/13  7:50 AM      Result Value Range   Sodium 136  135 - 145 mEq/L   Potassium 4.3  3.5 - 5.1 mEq/L   Chloride 96  96 - 112 mEq/L   CO2 28  19 - 32 mEq/L   Glucose, Bld 111 (*) 70 - 99 mg/dL   BUN 15  6 - 23 mg/dL   Creatinine, Ser 7.82  0.50 - 1.10 mg/dL   Calcium 95.6  8.4 - 21.3 mg/dL   Total Protein 8.6 (*) 6.0 - 8.3 g/dL   Albumin 4.4  3.5 - 5.2  g/dL   AST 92 (*) 0 - 37 U/L   ALT 104 (*) 0 - 35 U/L   Alkaline Phosphatase 93  39 - 117 U/L   Total Bilirubin 0.5  0.3 - 1.2 mg/dL   GFR calc non Af Amer >90  >90 mL/min   GFR calc Af Amer >90  >90 mL/min   Comment: (NOTE)     The eGFR has been calculated using the CKD EPI equation.     This calculation has not been validated in all clinical situations.     eGFR's persistently <90 mL/min signify possible Chronic Kidney     Disease.  ETHANOL     Status: None   Collection Time    02/23/13  7:50 AM      Result Value Range   Alcohol, Ethyl (B) <11  0 - 11 mg/dL   Comment:            LOWEST DETECTABLE LIMIT FOR     SERUM ALCOHOL IS 11 mg/dL  FOR MEDICAL PURPOSES ONLY  ACETAMINOPHEN LEVEL     Status: None   Collection Time    02/23/13  7:50 AM      Result Value Range   Acetaminophen (Tylenol), Serum <15.0  10 - 30 ug/mL   Comment:            THERAPEUTIC CONCENTRATIONS VARY     SIGNIFICANTLY. A RANGE OF 10-30     ug/mL MAY BE AN EFFECTIVE     CONCENTRATION FOR MANY PATIENTS.     HOWEVER, SOME ARE BEST TREATED     AT CONCENTRATIONS OUTSIDE THIS     RANGE.     ACETAMINOPHEN CONCENTRATIONS     >150 ug/mL AT 4 HOURS AFTER     INGESTION AND >50 ug/mL AT 12     HOURS AFTER INGESTION ARE     OFTEN ASSOCIATED WITH TOXIC     REACTIONS.  SALICYLATE LEVEL     Status: Abnormal   Collection Time    02/23/13  7:50 AM      Result Value Range   Salicylate Lvl <2.0 (*) 2.8 - 20.0 mg/dL  URINE RAPID DRUG SCREEN (HOSP PERFORMED)     Status: Abnormal   Collection Time    02/23/13  8:15 AM      Result Value Range   Opiates NONE DETECTED  NONE DETECTED   Cocaine NONE DETECTED  NONE DETECTED   Benzodiazepines NONE DETECTED  NONE DETECTED   Amphetamines POSITIVE (*) NONE DETECTED   Tetrahydrocannabinol POSITIVE (*) NONE DETECTED   Barbiturates NONE DETECTED  NONE DETECTED   Comment:            DRUG SCREEN FOR MEDICAL PURPOSES     ONLY.  IF CONFIRMATION IS NEEDED     FOR ANY PURPOSE,  NOTIFY LAB     WITHIN 5 DAYS.                LOWEST DETECTABLE LIMITS     FOR URINE DRUG SCREEN     Drug Class       Cutoff (ng/mL)     Amphetamine      1000     Barbiturate      200     Benzodiazepine   200     Tricyclics       300     Opiates          300     Cocaine          300     THC              50  URINALYSIS, ROUTINE W REFLEX MICROSCOPIC     Status: Abnormal   Collection Time    02/23/13  8:15 AM      Result Value Range   Color, Urine AMBER (*) YELLOW   Comment: BIOCHEMICALS MAY BE AFFECTED BY COLOR   APPearance TURBID (*) CLEAR   Specific Gravity, Urine 1.030  1.005 - 1.030   pH 5.5  5.0 - 8.0   Glucose, UA NEGATIVE  NEGATIVE mg/dL   Hgb urine dipstick NEGATIVE  NEGATIVE   Bilirubin Urine SMALL (*) NEGATIVE   Ketones, ur 15 (*) NEGATIVE mg/dL   Protein, ur 30 (*) NEGATIVE mg/dL   Urobilinogen, UA 1.0  0.0 - 1.0 mg/dL   Nitrite NEGATIVE  NEGATIVE   Leukocytes, UA NEGATIVE  NEGATIVE  PREGNANCY, URINE     Status: None   Collection Time    02/23/13  8:15  AM      Result Value Range   Preg Test, Ur NEGATIVE  NEGATIVE   Comment:            THE SENSITIVITY OF THIS     METHODOLOGY IS >20 mIU/mL.  URINE MICROSCOPIC-ADD ON     Status: Abnormal   Collection Time    02/23/13  8:15 AM      Result Value Range   Squamous Epithelial / LPF MANY (*) RARE   WBC, UA 3-6  <3 WBC/hpf   RBC / HPF 0-2  <3 RBC/hpf   Bacteria, UA FEW (*) RARE   Urine-Other MUCOUS PRESENT     Comment: AMORPHOUS URATES/PHOSPHATES  URINE CULTURE     Status: None   Collection Time    02/23/13  8:15 AM      Result Value Range   Specimen Description URINE, CLEAN CATCH     Special Requests NONE     Culture  Setup Time       Value: 02/23/2013 09:36     Performed at [facility] Count       Value: NO GROWTH     Performed at [facility]   Culture       Value: NO GROWTH     Performed at [facility]   Report Status 02/24/2013 FINAL    HIV ANTIBODY (ROUTINE  TESTING)     Status: None   Collection Time    02/23/13  6:26 PM      Result Value Range   HIV NON REACTIVE  NON REACTIVE   Comment: Performed at [facility]  TROPONIN I     Status: None   Collection Time    02/23/13  6:26 PM      Result Value Range   Troponin I <0.30  <0.30 ng/mL   Comment:            Due to the release kinetics of cTnI,     a negative result within the first hours     of the onset of symptoms does not rule out     myocardial infarction with certainty.     If myocardial infarction is still suspected,     repeat the test at appropriate intervals.  HCG, QUANTITATIVE, PREGNANCY     Status: None   Collection Time    02/23/13  6:26 PM      Result Value Range   hCG, Beta Chain, Quant, S <1  <5 mIU/mL   Comment:              GEST. AGE      CONC.  (mIU/mL)       <=1 WEEK        5 - 50         2 WEEKS       50 - 500         3 WEEKS       100 - 10,000         4 WEEKS     1,000 - 30,000         5 WEEKS     3,500 - 115,000       6-8 WEEKS     12,000 - 270,000        12 WEEKS     15,000 - 220,000                FEMALE AND NON-PREGNANT FEMALE:  LESS THAN 5 mIU/mL  MRSA PCR SCREENING     Status: Abnormal   Collection Time    02/23/13  6:57 PM      Result Value Range   MRSA by PCR POSITIVE (*) NEGATIVE   Comment: RESULT CALLED TO, READ BACK BY AND VERIFIED WITH:     L HITT,RN [phone] WILDER     CORRECTED RESULTS CALLED TO:     D FIELDS,RN AT [id number] [id number] BY K BARR                The GeneXpert MRSA Assay (FDA     approved for NASAL specimens     only), is one component of a     comprehensive MRSA colonization     surveillance program. It is not     intended to diagnose MRSA     infection nor to guide or     monitor treatment for     MRSA infections.     CORRECTED ON 10/24 AT 0901: PREVIOUSLY REPORTED AS RESULT CALLED TO, READ BACK BY AND VERIFIED WITH: LHITT,RN [phone] WILDER        The GeneXpert MRSA Assay (FDA approved for NASAL specimens  only), is one component of a comprehensive MRSA      colonization surveillance program. It is not intended to diagnose MRSA infection nor to guide or monitor treatment for MRSA infections. POSITIVE  TROPONIN I     Status: None   Collection Time    02/23/13 11:11 PM      Result Value Range   Troponin I <0.30  <0.30 ng/mL   Comment:            Due to the release kinetics of cTnI,     a negative result within the first hours     of the onset of symptoms does not rule out     myocardial infarction with certainty.     If myocardial infarction is still suspected,     repeat the test at appropriate intervals.  COMPREHENSIVE METABOLIC PANEL     Status: Abnormal   Collection Time    02/24/13  5:00 AM      Result Value Range   Sodium 138  135 - 145 mEq/L   Potassium 3.5  3.5 - 5.1 mEq/L   Comment: DELTA CHECK NOTED   Chloride 104  96 - 112 mEq/L   Comment: DELTA CHECK NOTED   CO2 24  19 - 32 mEq/L   Glucose, Bld 124 (*) 70 - 99 mg/dL   BUN 9  6 - 23 mg/dL   Creatinine, Ser 4.69  0.50 - 1.10 mg/dL   Calcium 8.4  8.4 - 62.9 mg/dL   Total Protein 5.9 (*) 6.0 - 8.3 g/dL   Albumin 3.0 (*) 3.5 - 5.2 g/dL   AST 60 (*) 0 - 37 U/L   ALT 66 (*) 0 - 35 U/L   Alkaline Phosphatase 66  39 - 117 U/L   Total Bilirubin 0.5  0.3 - 1.2 mg/dL   GFR calc non Af Amer >90  >90 mL/min   GFR calc Af Amer >90  >90 mL/min   Comment: (NOTE)     The eGFR has been calculated using the CKD EPI equation.     This calculation has not been validated in all clinical situations.     eGFR's persistently <90 mL/min signify possible Chronic Kidney     Disease.  CBC     Status: None  Collection Time    02/24/13  5:00 AM      Result Value Range   WBC 7.5  4.0 - 10.5 K/uL   RBC 4.00  3.87 - 5.11 MIL/uL   Hemoglobin 12.6  12.0 - 15.0 g/dL   HCT 81.1  91.4 - 78.2 %   MCV 91.0  78.0 - 100.0 fL   MCH 31.5  26.0 - 34.0 pg   MCHC 34.6  30.0 - 36.0 g/dL   RDW 95.6  21.3 - 08.6 %   Platelets 170  150 - 400 K/uL    Comment: DELTA CHECK NOTED     SPECIMEN CHECKED FOR CLOTS     REPEATED TO VERIFY  TROPONIN I     Status: None   Collection Time    02/24/13  5:00 AM      Result Value Range   Troponin I <0.30  <0.30 ng/mL   Comment:            Due to the release kinetics of cTnI,     a negative result within the first hours     of the onset of symptoms does not rule out     myocardial infarction with certainty.     If myocardial infarction is still suspected,     repeat the test at appropriate intervals.   Labs are reviewed and are pertinent for UDS positive for amphetamines and marijuana.  Current Facility-Administered Medications  Medication Dose Route Frequency Provider Last Rate Last Dose  . 0.9 %  sodium chloride infusion   Intravenous Continuous Darden Palmer, MD 100 mL/hr at 02/23/13 1905 100 mL/hr at 02/23/13 1905  . Chlorhexidine Gluconate Cloth 2 % PADS 6 each  6 each Topical Q0600 Jonah Blue, DO   6 each at 02/24/13 [phone]  . folic acid (FOLVITE) tablet 1 mg  1 mg Oral Daily Darden Palmer, MD   1 mg at 02/24/13 1021  . heparin injection 5,000 Units  5,000 Units Subcutaneous Q8H Darden Palmer, MD   5,000 Units at 02/24/13 1341  . [START ON 02/25/2013] influenza vac split quadrivalent PF (FLUARIX) injection 0.5 mL  0.5 mL Intramuscular Tomorrow-1000 Darden Palmer, MD      . LORazepam (ATIVAN) tablet 1 mg  1 mg Oral Q6H PRN Darden Palmer, MD   1 mg at 02/24/13 0525   Or  . LORazepam (ATIVAN) injection 1 mg  1 mg Intravenous Q6H PRN Darden Palmer, MD      . multivitamin with minerals tablet 1 tablet  1 tablet Oral Daily Darden Palmer, MD   1 tablet at 02/24/13 1022  . mupirocin ointment (BACTROBAN) 2 % 1 application  1 application Nasal BID Jonah Blue, DO   1 application at 02/24/13 1022  . ondansetron (ZOFRAN) tablet 4 mg  4 mg Oral Q6H PRN Darden Palmer, MD   4 mg at 02/23/13 2324   Or  . ondansetron (ZOFRAN) injection 4 mg  4 mg Intravenous Q6H PRN Darden Palmer, MD      .  sodium chloride 0.9 % injection 3 mL  3 mL Intravenous Q12H Darden Palmer, MD      . thiamine (VITAMIN B-1) tablet 100 mg  100 mg Oral Daily Darden Palmer, MD   100 mg at 02/24/13 1022    Psychiatric Specialty Exam:     Blood pressure 81/64, pulse 93, temperature 97.5 F (36.4 C), temperature source Oral, resp. rate 16, last menstrual period 01/19/2013, SpO2 100.00%.There is no weight on file to  calculate BMI.  General Appearance: Disheveled, Guarded and Patient has multiple cuts on her arm.  Eye Contact::  Fair  Speech:  Slow  Volume:  Decreased  Mood:  Depressed and Hopeless  Affect:  Constricted  Thought Process:  Intact  Orientation:  Full (Time, Place, and Person)  Thought Content:  Paranoid Ideation and Rumination  Suicidal Thoughts:  Yes.  without intent/plan  Homicidal Thoughts:  No  Memory:  Negative  Judgement:  Fair  Insight:  Fair  Psychomotor Activity:  Decreased  Concentration:  Fair  Recall:  Fair  Akathisia:  No  Handed:  Right  AIMS (if indicated):     Assets:  Desire for Improvement  Sleep:      Treatment Plan Summary: Medication management Requires inpatient psychiatric treatment for the management of her depression and bipolar disorder.  [facility]  Patient requires sitter for safety.   Tanairi Cypert T. 02/24/2013 4:42 PM

## 2013-02-25 DIAGNOSIS — F10939 Alcohol use, unspecified with withdrawal, unspecified: Principal | ICD-10-CM

## 2013-02-25 DIAGNOSIS — F319 Bipolar disorder, unspecified: Secondary | ICD-10-CM

## 2013-02-25 DIAGNOSIS — D72829 Elevated white blood cell count, unspecified: Secondary | ICD-10-CM

## 2013-02-25 DIAGNOSIS — F10239 Alcohol dependence with withdrawal, unspecified: Principal | ICD-10-CM

## 2013-02-25 LAB — CBC
Hemoglobin: 12.6 g/dL (ref 12.0–15.0)
MCH: 31.5 pg (ref 26.0–34.0)
MCH: 31.2 pg (ref 26.0–34.0)
MCHC: 34.6 g/dL (ref 30.0–36.0)
MCHC: 34.1 g/dL (ref 30.0–36.0)
MCV: 90.9 fL (ref 78.0–100.0)
MCV: 91.6 fL (ref 78.0–100.0)
Platelets: 171 10*3/uL (ref 150–400)
Platelets: 164 10*3/uL (ref 150–400)
RBC: 4.04 MIL/uL (ref 3.87–5.11)
RDW: 12.5 % (ref 11.5–15.5)
WBC: 6.3 10*3/uL (ref 4.0–10.5)

## 2013-02-25 LAB — CULTURE, BLOOD (ROUTINE X 2)

## 2013-02-25 LAB — BASIC METABOLIC PANEL
BUN: 11 mg/dL (ref 6–23)
CO2: 21 mEq/L (ref 19–32)
Calcium: 8.5 mg/dL (ref 8.4–10.5)
Chloride: 103 mEq/L (ref 96–112)
Creatinine, Ser: 0.76 mg/dL (ref 0.50–1.10)
GFR calc non Af Amer: >90 mL/min (ref >90)

## 2013-02-25 MED ORDER — SODIUM CHLORIDE 0.9 % IV SOLN
500.0000 mg | Freq: Three times a day (TID) | INTRAVENOUS | Status: DC
  Administered 2013-02-25 – 2013-02-26 (×3): 500 mg via INTRAVENOUS
  Filled 2013-02-25 (×5): qty 500

## 2013-02-25 MED ORDER — VANCOMYCIN HCL 500 MG IV SOLR
500.0000 mg | Freq: Once | INTRAVENOUS | Status: AC
  Administered 2013-02-25: 500 mg via INTRAVENOUS
  Filled 2013-02-25: qty 500

## 2013-02-25 MED ORDER — NICOTINE 21 MG/24HR TD PT24
21.0000 mg | MEDICATED_PATCH | Freq: Every day | TRANSDERMAL | Status: DC
  Administered 2013-02-25 – 2013-02-26 (×2): 21 mg via TRANSDERMAL
  Filled 2013-02-25 (×2): qty 1

## 2013-02-25 NOTE — Progress Notes (Signed)
  Date: 02/25/2013  Patient name: Joanna Deleon  Medical record number: [id number]  Date of birth: [birth year]   This patient has been seen and the plan of care was discussed with the house staff. Please see their note for complete details. I concur with their findings with the following additions/corrections:  27 yo F with hx of ETOh, marijuana and methampehatmine use, came to hospital on 10-23 for detox per pt. She denies f/c prior to adm. She denies pain or LAN.  Filed Vitals:   02/25/13 0522  BP: 113/73  Pulse: 85  Temp: 98.4 F (36.9 C)  Resp: 16  Eyes: EOMI, PERRL, no injection, no sclera lesions Mouth; poor dentition Neck: no LAN, non-tender Chest: CTA CV: RRR without M Abd: BS+, soft, non-tender  Labs of note:  BCx 10-23 CNS HIV (-)  A/p Polysubstance abuse BCx+ (CNS)  would Check Hep C  Detox, get her resources to get involved with NA and AA Await her 2nd set of BCx. It is not clear that she has 2 positive bottles at this point so w/u for IE is not yet indicated, this single BCx for CNS could simply be a contaminant.   Ginnie Smart, MD 02/25/2013, 10:20 AM

## 2013-02-25 NOTE — Progress Notes (Signed)
ANTIBIOTIC CONSULT NOTE - INITIAL  Pharmacy Consult for vancomycin Indication: + blood Cx  No Known Allergies  Patient Measurements: Weight: 55kg  Vital Signs: Temp: 98.2 F (36.8 C) (10/24 2041) Temp src: Oral (10/24 2041) BP: 121/81 mmHg (10/24 2041) Pulse Rate: 85 (10/24 2041) Intake/Output from previous day: 10/24 0701 - 10/25 0700 In: 2692.5 [P.O.:1470; I.V.:1222.5] Out: 450 [Urine:450] Intake/Output from this shift: Total I/O In: 882.5 [P.O.:360; I.V.:522.5] Out: -   Labs:  Recent Labs  02/23/13 0750 02/24/13 0500  WBC 12.0* 7.5  HGB 15.1* 12.6  PLT 234 170  CREATININE 0.68 0.83    Microbiology: Recent Results (from the past 720 hour(s))  URINE CULTURE     Status: None   Collection Time    02/23/13  8:15 AM      Result Value Range Status   Specimen Description URINE, CLEAN CATCH   Final   Special Requests NONE   Final   Culture  Setup Time     Final   Value: 02/23/2013 09:36     Performed at [facility] Count     Final   Value: NO GROWTH     Performed at [facility]   Culture     Final   Value: NO GROWTH     Performed at [facility]   Report Status 02/24/2013 FINAL   Final  CULTURE, BLOOD (ROUTINE X 2)     Status: None   Collection Time    02/23/13  6:35 PM      Result Value Range Status   Specimen Description BLOOD ARM LEFT   Final   Special Requests BOTTLES DRAWN AEROBIC AND ANAEROBIC 10CC   Final   Culture  Setup Time     Final   Value: 02/24/2013 01:45     Performed at [facility]   Culture     Final   Value: GRAM POSITIVE COCCI IN CLUSTERS     Note: Gram Stain Report Called to,Read Back By and Verified With: BOBBIE SCOTT ON 02/24/2013 AT 11:53P BY WILEJ     Performed at [facility]   Report Status PENDING   Incomplete  MRSA PCR SCREENING     Status: Abnormal   Collection Time    02/23/13  6:57 PM      Result Value Range Status   MRSA by PCR POSITIVE (*) NEGATIVE Corrected    Comment: RESULT CALLED TO, READ BACK BY AND VERIFIED WITH:     L HITT,RN [id number] WILDER     CORRECTED RESULTS CALLED TO:     D FIELDS,RN AT [phone] BY K BARR                The GeneXpert MRSA Assay (FDA     approved for NASAL specimens     only), is one component of a     comprehensive MRSA colonization     surveillance program. It is not     intended to diagnose MRSA     infection nor to guide or     monitor treatment for     MRSA infections.     CORRECTED ON 10/24 AT 0901: PREVIOUSLY REPORTED AS RESULT CALLED TO, READ BACK BY AND VERIFIED WITH: LHITT,RN [phone] WILDER        The GeneXpert MRSA Assay (FDA approved for NASAL specimens only), is one component of a comprehensive MRSA      colonization surveillance program. It is  not intended to diagnose MRSA infection nor to guide or monitor treatment for MRSA infections. POSITIVE    Medical History: Past Medical History  Diagnosis Date  . Bipolar 1 disorder   . Depression     Medications:  Prescriptions prior to admission  Medication Sig Dispense Refill  . ibuprofen (ADVIL,MOTRIN) 200 MG tablet Take 600 mg by mouth every 8 (eight) hours as needed for pain.        Scheduled:  . Chlorhexidine Gluconate Cloth  6 each Topical Q0600  . folic acid  1 mg Oral Daily  . heparin  5,000 Units Subcutaneous Q8H  . influenza vac split quadrivalent PF  0.5 mL Intramuscular Tomorrow-1000  . multivitamin with minerals  1 tablet Oral Daily  . mupirocin ointment  1 application Nasal BID  . sodium chloride  3 mL Intravenous Q12H  . thiamine  100 mg Oral Daily   Infusions:  . sodium chloride 75 mL/hr at 02/25/13 0047    Assessment: 27yo female w/ h/o polysubstance abuse admitted 1d ago for EtOH withdrawal now w/ GPC in clusters in blood Cx, to begin IV ABX.  Goal of Therapy:  Vancomycin trough level 15-20 mcg/ml  Plan:  Will begin vancomycin 500mg  IV Q8H and monitor CBC, Cx, levels prn.  Vernard Gambles, PharmD, BCPS   02/25/2013,1:21 AM

## 2013-02-25 NOTE — Progress Notes (Addendum)
Subjective: Joanna Deleon was seen and examined at bedside this morning.  She is craving a cigarette but otherwise claims to feel fine.  She denies any shaking, chest pain, N/V/D, fever, chills, SOB, abdominal pain, or any urinary complaints. She is still interested in detox.   Objective: Vital signs in last 24 hours: Filed Vitals:   02/24/13 1656 02/24/13 2041 02/25/13 0522 02/25/13 0900  BP: 124/68 121/81 113/73   Pulse:  85 85   Temp:  98.2 F (36.8 C) 98.4 F (36.9 C)   TempSrc:  Oral Oral   Resp:  16 16   Height:    5\' 1"  (1.549 m)  Weight:    125 lb (56.7 kg)  SpO2:  99% 98%    Weight change:   Intake/Output Summary (Last 24 hours) at 02/25/13 1057 Last data filed at 02/25/13 0900  Gross per 24 hour  Intake 3197.5 ml  Output      0 ml  Net 3197.5 ml   Physical Exam: General: No acute distress, tired HEENT: Vision grossly intact Lungs: Clear to ascultation bilaterally, no wheezing Heart: RRR Abdomen: Soft, non-tender, non-distended, normal bowel sounds Extremities: No cyanosis, clubbing, or edema, moving all 4 extremities Neurologic: Alert & oriented X3, cranial nerves II-XII grossly intact  Lab Results: Basic Metabolic Panel:  Recent Labs Lab 02/24/13 0500 02/25/13 0541  NA 138 136  K 3.5 4.0  CL 104 103  CO2 24 21  GLUCOSE 124* 87  BUN 9 11  CREATININE 0.83 0.76  CALCIUM 8.4 8.5   Liver Function Tests:  Recent Labs Lab 02/23/13 0750 02/24/13 0500  AST 92* 60*  ALT 104* 66*  ALKPHOS 93 66  BILITOT 0.5 0.5  PROT 8.6* 5.9*  ALBUMIN 4.4 3.0*   CBC:  Recent Labs Lab 02/25/13 0210 02/25/13 0541  WBC 6.3 7.0  HGB 12.4 12.6  HCT 35.8* 37.0  MCV 90.9 91.6  PLT 171 164   Cardiac Enzymes:  Recent Labs Lab 02/23/13 1826 02/23/13 2311 02/24/13 0500  TROPONINI <0.30 <0.30 <0.30   Urine Drug Screen: Drugs of Abuse     Component Value Date/Time   LABOPIA NONE DETECTED 02/23/2013 0815   COCAINSCRNUR NONE DETECTED 02/23/2013 0815   LABBENZ NONE DETECTED 02/23/2013 0815   AMPHETMU POSITIVE* 02/23/2013 0815   THCU POSITIVE* 02/23/2013 0815   LABBARB NONE DETECTED 02/23/2013 0815    Alcohol Level:  Recent Labs Lab 02/23/13 0750  ETH <11   Urinalysis:  Recent Labs Lab 02/23/13 0815  COLORURINE AMBER*  LABSPEC 1.030  PHURINE 5.5  GLUCOSEU NEGATIVE  HGBUR NEGATIVE  BILIRUBINUR SMALL*  KETONESUR 15*  PROTEINUR 30*  UROBILINOGEN 1.0  NITRITE NEGATIVE  LEUKOCYTESUR NEGATIVE    Micro Results: Recent Results (from the past 240 hour(s))  URINE CULTURE     Status: None   Collection Time    02/23/13  8:15 AM      Result Value Range Status   Specimen Description URINE, CLEAN CATCH   Final   Special Requests NONE   Final   Culture  Setup Time     Final   Value: 02/23/2013 09:36     Performed at [facility] Count     Final   Value: NO GROWTH     Performed at [facility]   Culture     Final   Value: NO GROWTH     Performed at [facility]   Report Status 02/24/2013 FINAL  Final  CULTURE, BLOOD (ROUTINE X 2)     Status: None   Collection Time    02/23/13  6:35 PM      Result Value Range Status   Specimen Description BLOOD ARM LEFT   Final   Special Requests BOTTLES DRAWN AEROBIC AND ANAEROBIC 10CC   Final   Culture  Setup Time     Final   Value: 02/24/2013 01:45     Performed at [facility]   Culture     Final   Value: STAPHYLOCOCCUS SPECIES (COAGULASE NEGATIVE)     Note: THE SIGNIFICANCE OF ISOLATING THIS ORGANISM FROM A SINGLE SET OF BLOOD CULTURES WHEN MULTIPLE SETS ARE DRAWN IS UNCERTAIN. PLEASE NOTIFY THE MICROBIOLOGY DEPARTMENT WITHIN ONE WEEK IF SPECIATION AND SENSITIVITIES ARE REQUIRED.     Note: Gram Stain Report Called to,Read Back By and Verified With: BOBBIE SCOTT ON 02/24/2013 AT 11:53P BY WILEJ     Performed at [facility]   Report Status 02/25/2013 FINAL   Final  MRSA PCR SCREENING     Status: Abnormal   Collection Time     02/23/13  6:57 PM      Result Value Range Status   MRSA by PCR POSITIVE (*) NEGATIVE Corrected   Comment: RESULT CALLED TO, READ BACK BY AND VERIFIED WITH:     L HITT,RN [phone] WILDER     CORRECTED RESULTS CALLED TO:     D FIELDS,RN AT [id number] [id number] BY K BARR                The GeneXpert MRSA Assay (FDA     approved for NASAL specimens     only), is one component of a     comprehensive MRSA colonization     surveillance program. It is not     intended to diagnose MRSA     infection nor to guide or     monitor treatment for     MRSA infections.     CORRECTED ON 10/24 AT 0901: PREVIOUSLY REPORTED AS RESULT CALLED TO, READ BACK BY AND VERIFIED WITH: LHITT,RN [phone] WILDER        The GeneXpert MRSA Assay (FDA approved for NASAL specimens only), is one component of a comprehensive MRSA      colonization surveillance program. It is not intended to diagnose MRSA infection nor to guide or monitor treatment for MRSA infections. POSITIVE   Medications: I have reviewed the patient's current medications. Scheduled Meds: . Chlorhexidine Gluconate Cloth  6 each Topical Q0600  . folic acid  1 mg Oral Daily  . heparin  5,000 Units Subcutaneous Q8H  . multivitamin with minerals  1 tablet Oral Daily  . mupirocin ointment  1 application Nasal BID  . sodium chloride  3 mL Intravenous Q12H  . thiamine  100 mg Oral Daily  . vancomycin  500 mg Intravenous Q8H   Continuous Infusions:   PRN Meds:.acetaminophen, LORazepam, LORazepam, ondansetron (ZOFRAN) IV, ondansetron  Assessment/Plan: Joanna Deleon is a 27 y.o. female w/ PMHx of depression, bipolar disorder, alcohol and substance abuse, admitted for alcohol withdrawal.  Alcohol withdrawal-history of alcohol and substance abuse (>12 pack beer per day, crack cocaine and marijuana, distant hx of IVDA as well), last drink was <24 hours prior to admission.  EtOH <11 and UDS +ve for methamphetamines and THC on admission.   -Continue w/ CIWA  protocol, Thiamine + Folate.  Last ativan given 1235am this morning -LFTs trending down -  Continue fall precautions  -Seizure precautions  -Neuro checks q4h   Substance Abuse--Most recently uses crack cocaine and marijuana but also admits to IVDU in the past. UDS +ve for methamphetamines and THC on admission  -Troponin x3 negative  -HIV Ab non-reactive -Blood cultures pending 10/23--1/2 positive for coag neg staph (aerobic), other bottle NGTD, ?likely contaminant -appreciate social work following -appreciate psychiatry following, last seen by Dr. Lolly Mustache who recommends transfer to behavioral health center for inpatient treatment   Leukocytosis--resolved.  Admitted to the ED with [facility] of 12.0. Most likely reactive, but 1/2 blood culture bottles positive for coag neg staph (?contaminant). Afebrile since admission.  -urine culture no growth -Blood cultures pending, 1/2 aerobic bottle coag neg staph, other bottle NGTD -started on vancomycin 02/25/13 -U-preg -ve  -HIV Ab non-reactive  Bipolar Disorder/Depression/"cutting" history- Denies suicidal intent at this time.  -Psych following appreciated. Recommends sitter present in room for safety. Also recommends inpatient Mental Health placement.  -Salicylate/Acetaminophen levels -ve in ED -appreciate SW assistance for behavioral health transfer  Dispo: Disposition is deferred at this time, awaiting improvement of current medical problems.  Anticipated discharge in approximately 1-2 day(s).   The patient does not have a current PCP (No Pcp Per Patient) and does need an [facility] hospital follow-up appointment after discharge.  The patient does not have transportation limitations that hinder transportation to clinic appointments.  .Services Needed at time of discharge: Y = Yes, Blank = No PT:   OT:   RN:   Equipment:   Other:     LOS: 2 days   Darden Palmer, MD 02/25/2013, 10:57 AM Pager: [phone]

## 2013-02-25 NOTE — Progress Notes (Signed)
Physician aware. States will place antibiotic orders.

## 2013-02-25 NOTE — Progress Notes (Addendum)
Critical lab call received  blood culture gram positive cocci and clusters.  Called to physician on call

## 2013-02-26 ENCOUNTER — Inpatient Hospital Stay (HOSPITAL_COMMUNITY)
Admission: AD | Admit: 2013-02-26 | Discharge: 2013-03-02 | DRG: 897 | Disposition: A | Discharge status: Home / Self Care | Payer: Federal, State, Local not specified - Other | Source: Intra-hospital | Attending: Psychiatry | Admitting: Psychiatry

## 2013-02-26 DIAGNOSIS — Z5900 Homelessness unspecified: Secondary | ICD-10-CM

## 2013-02-26 DIAGNOSIS — F319 Bipolar disorder, unspecified: Secondary | ICD-10-CM | POA: Diagnosis present

## 2013-02-26 DIAGNOSIS — F3289 Other specified depressive episodes: Secondary | ICD-10-CM

## 2013-02-26 DIAGNOSIS — F329 Major depressive disorder, single episode, unspecified: Secondary | ICD-10-CM | POA: Diagnosis present

## 2013-02-26 DIAGNOSIS — F1910 Other psychoactive substance abuse, uncomplicated: Secondary | ICD-10-CM | POA: Diagnosis present

## 2013-02-26 DIAGNOSIS — F32A Depression, unspecified: Secondary | ICD-10-CM

## 2013-02-26 DIAGNOSIS — F1020 Alcohol dependence, uncomplicated: Secondary | ICD-10-CM | POA: Diagnosis present

## 2013-02-26 DIAGNOSIS — F10239 Alcohol dependence with withdrawal, unspecified: Principal | ICD-10-CM | POA: Diagnosis present

## 2013-02-26 DIAGNOSIS — F10939 Alcohol use, unspecified with withdrawal, unspecified: Principal | ICD-10-CM | POA: Diagnosis present

## 2013-02-26 DIAGNOSIS — F332 Major depressive disorder, recurrent severe without psychotic features: Secondary | ICD-10-CM | POA: Diagnosis present

## 2013-02-26 DIAGNOSIS — D72829 Elevated white blood cell count, unspecified: Secondary | ICD-10-CM | POA: Diagnosis present

## 2013-02-26 DIAGNOSIS — Z79899 Other long term (current) drug therapy: Secondary | ICD-10-CM

## 2013-02-26 LAB — VANCOMYCIN, TROUGH: Vancomycin Tr: 5.2 ug/mL — ABNORMAL LOW (ref 10.0–20.0)

## 2013-02-26 LAB — CBC
MCH: 31.4 pg (ref 26.0–34.0)
MCHC: 34.3 g/dL (ref 30.0–36.0)
Platelets: 157 10*3/uL (ref 150–400)
RBC: 4.2 MIL/uL (ref 3.87–5.11)

## 2013-02-26 MED ORDER — MAGNESIUM HYDROXIDE 400 MG/5ML PO SUSP: 30.0000 mL | Freq: Every day | ORAL | Status: DC | PRN

## 2013-02-26 MED ORDER — VANCOMYCIN HCL IN DEXTROSE 750-5 MG/150ML-% IV SOLN
750.0000 mg | Freq: Three times a day (TID) | INTRAVENOUS | Status: DC
  Filled 2013-02-26: qty 150

## 2013-02-26 MED ORDER — TRAZODONE HCL 50 MG PO TABS
50.0000 mg | ORAL_TABLET | Freq: Every evening | ORAL | Status: DC | PRN
  Administered 2013-02-26 – 2013-03-01 (×4): 50 mg via ORAL
  Filled 2013-02-26 (×4): qty 1
  Filled 2013-02-26: qty 14

## 2013-02-26 MED ORDER — ACETAMINOPHEN 325 MG PO TABS
650.0000 mg | ORAL_TABLET | Freq: Four times a day (QID) | ORAL | Status: DC | PRN
  Administered 2013-02-27: 650 mg via ORAL
  Filled 2013-02-26: qty 2

## 2013-02-26 MED ORDER — NICOTINE POLACRILEX 2 MG MT GUM
2.0000 mg | CHEWING_GUM | OROMUCOSAL | Status: DC | PRN
  Administered 2013-02-27: 2 mg via ORAL
  Filled 2013-02-26: qty 1

## 2013-02-26 MED ORDER — NICOTINE 21 MG/24HR TD PT24
21.0000 mg | MEDICATED_PATCH | Freq: Every day | TRANSDERMAL | Status: DC
  Filled 2013-02-26: qty 1

## 2013-02-26 MED ORDER — VANCOMYCIN HCL IN DEXTROSE 1-5 GM/200ML-% IV SOLN
1000.0000 mg | Freq: Once | INTRAVENOUS | Status: DC
  Filled 2013-02-26: qty 200

## 2013-02-26 MED ORDER — ALUM & MAG HYDROXIDE-SIMETH 200-200-20 MG/5ML PO SUSP: 30.0000 mL | ORAL | Status: DC | PRN

## 2013-02-26 MED ORDER — CHLORDIAZEPOXIDE HCL 25 MG PO CAPS
25.0000 mg | ORAL_CAPSULE | Freq: Four times a day (QID) | ORAL | Status: DC | PRN
  Administered 2013-02-26 – 2013-03-01 (×5): 25 mg via ORAL
  Filled 2013-02-26 (×5): qty 1

## 2013-02-26 MED ORDER — ADULT MULTIVITAMIN W/MINERALS CH: 1.0000 | ORAL_TABLET | Freq: Every day | ORAL | Status: DC

## 2013-02-26 NOTE — Progress Notes (Signed)
Clinical Child psychotherapist (CSW) received call from MD stating that patient is ready to transfer to [facility]) today. Tina A/C house coverage at [facility] confirmed patient has a bed and confirmed that they received the voluntary admission forms that I faxed over. CSW arranged transportation through [place] and patient will be walked down to the ED area where the [place] driver will be at 1:61. Please reconsult if further social work needs arise. CSW signing off.   Jetta Lout, LCSWA Weekend CSW [phone]

## 2013-02-26 NOTE — Progress Notes (Signed)
  Date: 02/26/2013  Patient name: Joanna Deleon  Medical record number: [id number]  Date of birth: 27-Mar-1986   This patient'splan of care was discussed with the house staff. Please see their note for complete details. I concur with their findings with the following additions/corrections:  Pt has 1/2 BCx+ for CNS. This is most likely a contaminant. Provided her 2nd BCx remains (-), she can be d/c to her next placement.   Ginnie Smart, MD 02/26/2013, 9:10 AM

## 2013-02-26 NOTE — Tx Team (Signed)
Initial Interdisciplinary Treatment Plan  PATIENT STRENGTHS: (choose at least two) Ability for insight Communication skills General fund of knowledge Supportive family/friends  PATIENT STRESSORS: Financial difficulties Legal issue Substance abuse   PROBLEM LIST: Problem List/Patient Goals Date to be addressed Date deferred Reason deferred Estimated date of resolution  Polysubstance abuse 02-26-2013   D/C        Legal issues-probation 02-26-2013   D/C        MRSA-active 02-26-2013   D/C        Occupational concerns 02-26-2013   D/C               DISCHARGE CRITERIA:  Ability to meet basic life and health needs Adequate post-discharge living arrangements Improved stabilization in mood, thinking, and/or behavior Verbal commitment to aftercare and medication compliance Withdrawal symptoms are absent or subacute and managed without 24-hour nursing intervention  PRELIMINARY DISCHARGE PLAN: Attend 12-step recovery group Outpatient therapy Return to previous living arrangement  PATIENT/FAMIILY INVOLVEMENT: This treatment plan has been presented to and reviewed with the patient, Joanna Deleon, and/or family member, .  The patient and family have been given the opportunity to ask questions and make suggestions.  [name] 02/26/2013, 3:18 PM

## 2013-02-26 NOTE — Discharge Summary (Signed)
Name: Joanna Deleon MRN: [id number] DOB: March 16, 1986 27 y.o. PCP: No Pcp Per Patient  Date of Admission: 02/23/2013  7:04 AM Date of Discharge: 02/26/2013 Attending Physician: Jonah Blue, DO  Discharge Diagnosis: Principal Problem:   Alcohol withdrawal Active Problems:   Bipolar disorder, unspecified   Depression   Polysubstance abuse  Discharge Medications:   Medication List    STOP taking these medications       ibuprofen 200 MG tablet  Commonly known as:  ADVIL,MOTRIN      TAKE these medications       multivitamin with minerals Tabs tablet  Take 1 tablet by mouth daily.       Disposition and follow-up:   Joanna Deleon was discharged from [facility] in Stable condition to behavioral health.  At the hospital follow up visit please address:  1.   Alcohol dependence/withdrawal--expressed interest in detox Polysubstance abuse: provide resources for assistance and cessation services Bipolar disorder and depression--hx of "cutting".  Follow up psychiatry recommendations.  Transferred to behavioral health per psychiatry recommendations on discharge.   2.  Pending labs/ test needing follow-up: f/u blood culture 02/23/13.  1/2 bottle positive for coag neg staph, likely contaminant.  Follow-up Appointments: Follow-up Information   Schedule an appointment as soon as possible for a visit with [facility]    . (please call for a hospital follow up appointment and establish pcp)    Contact information:   [address] [place] [address] [phone]      Call [facility]. (for hospital follow up appointment if pcp not established)    Specialty:  Internal Medicine   Contact information:   [address] [id number] [place] [address] [phone]     Consultations:  Psychiatry  Admission HPI: Joanna Deleon is a 27 y.o. female w/ PMHx of depression, bipolar  disorder, alcohol and substance abuse, presents to the ED for alcohol withdrawal. The patient is a poor historian d/t possible substance abuse and withdrawal state. The patient claims she has not had any alcohol to drink for the past 2-3 days, when she drank 2 40 oz beers. She says she came to the ED because she has a serious alcohol problem and wants to quit. She admits to recent crack-cocaine abuse and marijuana use. Also admits to previous IVDU in the past, but not for several months, and only a couple of times. Patient does admit to being a "cutter" and had visible scarring on her arms from doing so. She claims she has not cut herself in some time and denies any suicidal or homicidal ideations. She also claims that when she does drink, she has at least a 12 pack. She denies any nausea, vomiting, abdominal pain, dizziness, lightheadedness, chest pain or SOB, recent visual, tactile, or auditory hallucinations. The patient was very agitated and tachycardic when seen in the ED. She was refusing to sit down and stay in her bed. Patient given a total of 5 mg Ativan in the ED. When interviewed, she also had slight slurring of speech.  The patient's recent history also discussed with her ex-boyfriend who claims she has been living on the street for the past several weeks and has been drinking excessively and spending all of her money on crack-cocaine. He claims he has been with her for the past day and she drank alcohol last night (02/22/13). He and her "cousin" attempted to take her to  a rehab facility but she refused and they then brought her to the ED. He also claims that she frequently cuts herself, most recently last week.   Hospital Course by problem list: Principal Problem:   Alcohol withdrawal Active Problems:   Bipolar disorder, unspecified   Depression   Polysubstance abuse   Leukocytosis, unspecified   Alcohol withdrawal: Resolved during hospital course.  History of alcohol and substance abuse  (>12 pack beer per day, crack cocaine and marijuana, distant hx of IVDA as well), last drink was <24 hours prior to admission. EtOH <11 and UDS +ve for methamphetamines and THC on admission. Initially, tachycardic and restless with some hallucinations which resolved during admission along with negative cardiac enzymes. Withdrawal managed per CIWA protocol this hospital course along with IVF, thiamine, and folate.  Counseling provided for cessation of substances this admission and encouraged cessation.  She is interested in detox.  LFTs trended down during hospital course.  Social work was consulted during hospital course.  She is encouraged to establish with a primary care physician.   Substance Abuse--Most recently admits to use of crack cocaine and marijuana along with alcohol and tobacco, but also admits to IVDU in the past. UDS on admission was positive for amphetamines and THC.  HIV Ab non-reactive.  Social work was consulted this admission along with psychiatry.  She will be transferred to behavioral health for further treatment per psychiatry. Counseling for cessation is encouraged and provided during admission.  Follow up with pcp.   Leukocytosis--resolved during hospital course.  Initially WBC's of 12.3 on admission but afebrile during admission. Most likely reactive, but 1/2 blood culture bottles positive for coag neg staph.  Joanna Deleon was initially started on empiric vancomycin for positive blood culture which was discontinued on 02/26/13 due to likely contaminant findings. Final results will need to be followed up by pcp.  Joanna Deleon was initially started on empiric vancomycin for positive blood culture which was discontinued on 02/26/13 due to likely contaminant. Urine culture showed no growth.   Bipolar Disorder/Depression/"cutting" history- Denied suicidal or homicidal ideation this admission.  Visible healed laceration marks/scars on upper extremities.  Salicylate/Acetaminophen levels negative.   Psychiatry was consulted and she was seen by Dr. Lolly Mustache who recommended inpatient psychiatric treatment for management of depression and bipolar disorder along with a sitter for safety.  Transfer will be initiated to behavioral health center per psychiatry recommendations with assistance of social work.    Discharge Vitals:   BP 116/68  Pulse 74  Temp(Src) 97.7 F (36.5 C) (Oral)  Resp 18  Ht 5\' 1"  (1.549 m)  Wt 125 lb (56.7 kg)  BMI 23.63 kg/m2  SpO2 99%  LMP 01/19/2013  Discharge Labs:  Results for orders placed during the hospital encounter of 02/23/13 (from the past 24 hour(s))  CBC     Status: None   Collection Time    02/26/13  5:12 AM      Result Value Range   WBC 6.2  4.0 - 10.5 K/uL   RBC 4.20  3.87 - 5.11 MIL/uL   Hemoglobin 13.2  12.0 - 15.0 g/dL   HCT 16.1  09.6 - 04.5 %   MCV 91.7  78.0 - 100.0 fL   MCH 31.4  26.0 - 34.0 pg   MCHC 34.3  30.0 - 36.0 g/dL   RDW 40.9  81.1 - 91.4 %   Platelets 157  150 - 400 K/uL  VANCOMYCIN, TROUGH     Status: Abnormal  Collection Time    02/26/13  9:00 AM      Result Value Range   Vancomycin Tr 5.2 (*) 10.0 - 20.0 ug/mL   Signed: Darden Palmer, MD 02/26/2013, 11:26 AM   Time Spent on Discharge: 40 minutes Services Ordered on Discharge: behavioral health Equipment Ordered on Discharge: none

## 2013-02-26 NOTE — Progress Notes (Addendum)
Subjective: Joanna Deleon was seen and examined at bedside this morning.  She appears to be doing well today, sitter by bedside, and says she is tired.  She asked me to speak to her boyfriend Joanna Deleon on the phone today in regards to updating him on her progress.  She remains under no visitor policy and we will clarify with psychiatry if this is to continue. She denies any shaking, chest pain, N/V/D, fever, chills, SOB, abdominal pain, or any urinary complaints. She currently denies suicidal or homicidal ideation.   Objective: Vital signs in last 24 hours: Filed Vitals:   02/25/13 0900 02/25/13 1406 02/25/13 2131 02/26/13 0625  BP:  113/73 114/75 116/68  Pulse:  71 94 74  Temp:  97.9 F (36.6 C) 97.6 F (36.4 C) 97.7 F (36.5 C)  TempSrc:  Oral Oral Oral  Resp:  18 18 18   Height: 5\' 1"  (1.549 m)     Weight: 125 lb (56.7 kg)     SpO2:  98% 97% 99%   Weight change:   Intake/Output Summary (Last 24 hours) at 02/26/13 0734 Last data filed at 02/25/13 1406  Gross per 24 hour  Intake    695 ml  Output      0 ml  Net    695 ml   Physical Exam: General: No acute distress, tired HEENT: Vision grossly intact Lungs: Clear to ascultation bilaterally, no wheezing Heart: RRR Abdomen: Soft, non-tender, non-distended, normal bowel sounds Extremities: No cyanosis, clubbing, or edema, moving all 4 extremities,l +healed laceration/scars on b/l arms Neurologic: Alert & oriented X3, cranial nerves II-XII grossly intact  Lab Results: Basic Metabolic Panel:  Recent Labs Lab 02/24/13 0500 02/25/13 0541  NA 138 136  K 3.5 4.0  CL 104 103  CO2 24 21  GLUCOSE 124* 87  BUN 9 11  CREATININE 0.83 0.76  CALCIUM 8.4 8.5   Liver Function Tests:  Recent Labs Lab 02/23/13 0750 02/24/13 0500  AST 92* 60*  ALT 104* 66*  ALKPHOS 93 66  BILITOT 0.5 0.5  PROT 8.6* 5.9*  ALBUMIN 4.4 3.0*   CBC:  Recent Labs Lab 02/25/13 0541 02/26/13 0512  WBC 7.0 6.2  HGB 12.6 13.2  HCT 37.0 38.5  MCV  91.6 91.7  PLT 164 157   Cardiac Enzymes:  Recent Labs Lab 02/23/13 1826 02/23/13 2311 02/24/13 0500  TROPONINI <0.30 <0.30 <0.30   Urine Drug Screen: Drugs of Abuse     Component Value Date/Time   LABOPIA NONE DETECTED 02/23/2013 0815   COCAINSCRNUR NONE DETECTED 02/23/2013 0815   LABBENZ NONE DETECTED 02/23/2013 0815   AMPHETMU POSITIVE* 02/23/2013 0815   THCU POSITIVE* 02/23/2013 0815   LABBARB NONE DETECTED 02/23/2013 0815    Alcohol Level:  Recent Labs Lab 02/23/13 0750  ETH <11   Urinalysis:  Recent Labs Lab 02/23/13 0815  COLORURINE AMBER*  LABSPEC 1.030  PHURINE 5.5  GLUCOSEU NEGATIVE  HGBUR NEGATIVE  BILIRUBINUR SMALL*  KETONESUR 15*  PROTEINUR 30*  UROBILINOGEN 1.0  NITRITE NEGATIVE  LEUKOCYTESUR NEGATIVE   Micro Results: Recent Results (from the past 240 hour(s))  URINE CULTURE     Status: None   Collection Time    02/23/13  8:15 AM      Result Value Range Status   Specimen Description URINE, CLEAN CATCH   Final   Special Requests NONE   Final   Culture  Setup Time     Final   Value: 02/23/2013 09:36  Performed at [facility] Count     Final   Value: NO GROWTH     Performed at [facility]   Culture     Final   Value: NO GROWTH     Performed at [facility]   Report Status 02/24/2013 FINAL   Final  CULTURE, BLOOD (ROUTINE X 2)     Status: None   Collection Time    02/23/13  6:25 PM      Result Value Range Status   Specimen Description BLOOD ARM RIGHT   Final   Special Requests BOTTLES DRAWN AEROBIC AND ANAEROBIC 10CC   Final   Culture  Setup Time     Final   Value: 02/24/2013 01:45     Performed at [facility]   Culture     Final   Value:        BLOOD CULTURE RECEIVED NO GROWTH TO DATE CULTURE WILL BE HELD FOR 5 DAYS BEFORE ISSUING A FINAL NEGATIVE REPORT     Performed at [facility]   Report Status PENDING   Incomplete  CULTURE, BLOOD (ROUTINE X 2)     Status: None    Collection Time    02/23/13  6:35 PM      Result Value Range Status   Specimen Description BLOOD ARM LEFT   Final   Special Requests BOTTLES DRAWN AEROBIC AND ANAEROBIC 10CC   Final   Culture  Setup Time     Final   Value: 02/24/2013 01:45     Performed at [facility]   Culture     Final   Value: STAPHYLOCOCCUS SPECIES (COAGULASE NEGATIVE)     Note: THE SIGNIFICANCE OF ISOLATING THIS ORGANISM FROM A SINGLE SET OF BLOOD CULTURES WHEN MULTIPLE SETS ARE DRAWN IS UNCERTAIN. PLEASE NOTIFY THE MICROBIOLOGY DEPARTMENT WITHIN ONE WEEK IF SPECIATION AND SENSITIVITIES ARE REQUIRED.     Note: Gram Stain Report Called to,Read Back By and Verified With: BOBBIE SCOTT ON 02/24/2013 AT 11:53P BY WILEJ     Performed at [facility]   Report Status 02/25/2013 FINAL   Final  MRSA PCR SCREENING     Status: Abnormal   Collection Time    02/23/13  6:57 PM      Result Value Range Status   MRSA by PCR POSITIVE (*) NEGATIVE Corrected   Comment: RESULT CALLED TO, READ BACK BY AND VERIFIED WITH:     L HITT,RN [phone] WILDER     CORRECTED RESULTS CALLED TO:     D FIELDS,RN AT [phone] [id number] BY K BARR                The GeneXpert MRSA Assay (FDA     approved for NASAL specimens     only), is one component of a     comprehensive MRSA colonization     surveillance program. It is not     intended to diagnose MRSA     infection nor to guide or     monitor treatment for     MRSA infections.     CORRECTED ON 10/24 AT 0901: PREVIOUSLY REPORTED AS RESULT CALLED TO, READ BACK BY AND VERIFIED WITH: LHITT,RN [phone] WILDER        The GeneXpert MRSA Assay (FDA approved for NASAL specimens only), is one component of a comprehensive MRSA      colonization surveillance program. It is not intended to diagnose MRSA infection nor  to guide or monitor treatment for MRSA infections. POSITIVE   Medications: I have reviewed the patient's current medications. Scheduled Meds: . Chlorhexidine Gluconate Cloth  6  each Topical Q0600  . folic acid  1 mg Oral Daily  . heparin  5,000 Units Subcutaneous Q8H  . multivitamin with minerals  1 tablet Oral Daily  . mupirocin ointment  1 application Nasal BID  . nicotine  21 mg Transdermal Daily  . sodium chloride  3 mL Intravenous Q12H  . thiamine  100 mg Oral Daily  . vancomycin  500 mg Intravenous Q8H   Continuous Infusions:   PRN Meds:.acetaminophen, LORazepam, LORazepam, ondansetron (ZOFRAN) IV, ondansetron  Assessment/Plan: Ms. ABYGALE KARPF is a 27 y.o. female w/ PMHx of depression, bipolar disorder, alcohol and substance abuse, admitted for alcohol withdrawal.  Alcohol withdrawal-resolved.  History of alcohol and substance abuse (>12 pack beer per day, crack cocaine and marijuana, distant hx of IVDA as well), last drink was <24 hours prior to admission.  EtOH <11 and UDS +ve for methamphetamines and THC on admission.   -Continue w/ CIWA protocol, Thiamine + Folate.  Last ativan given last night -LFTs trending down -Continue fall precautions, suicide/safety precautions for now  -Neuro checks q4h  -appreciate psychiatry and social work assistance for behavioral health transfer  Substance Abuse--Most recently uses crack cocaine and marijuana but also admits to IVDU in the past. UDS +ve for methamphetamines and THC on admission  -Troponin x3 negative  -HIV Ab non-reactive -Blood cultures pending 10/23--1/2 positive for coag neg staph (aerobic), other bottle NGTD, likely contaminant, will d/c vancomycin today -appreciate social work following--ready for transfer to behavioral health -appreciate psychiatry following, last seen by Dr. Lolly Mustache who recommends transfer to behavioral health center for inpatient treatment  -counseling for cessation encouraged and provided  Leukocytosis--resolved.  Admitted to the ED with [facility] of 12.0. Most likely reactive, but 1/2 blood culture bottles positive for coag neg staph (?contaminant). Afebrile since admission.    -urine culture no growth -Blood cultures pending, 1/2 aerobic bottle coag neg staph, other bottle NGTD -d/c vancomycin today as likely contaminant -U-preg -ve  -HIV Ab non-reactive  Bipolar Disorder/Depression/"cutting" history- Denies suicidal intent at this time. Salicylate/Acetaminophen levels -ve in ED -Psych following appreciated. Recommends sitter present in room for safety. Also recommends inpatient Behavioral Health placement.  -appreciate SW assistance for behavioral health transfer, possibly today?  Dispo: ready for transfer to behavioral health  The patient does not have a current PCP (No Pcp Per Patient) and does need an [facility] hospital follow-up appointment after discharge.  The patient does not have transportation limitations that hinder transportation to clinic appointments.  Services Needed at time of discharge: Y = Yes, Blank = No PT:   OT:   RN:   Equipment:   Other:     LOS: 3 days   Darden Palmer, MD 02/26/2013, 7:34 AM Pager: [phone]

## 2013-02-26 NOTE — Progress Notes (Signed)
Instructed patient on proper handwashing Teach back method used.  Patient verbalized and demonstrated proper technique. Continue to assess.

## 2013-02-26 NOTE — Progress Notes (Signed)
Call placed to the doctor on call for clarification of patient status, "safety versus suicide".  Psychiatry note states patient has suicidal ideations and is at risk for suicide and the order stated safety sitter for self injury.  Patient was requesting directions for her boyfriend so he could visit.  Order was changed to suicide sitter until day time physician can clarify the order, and doctor stated the patient could not have any visitors.  Macarthur Critchley, RN

## 2013-02-26 NOTE — Progress Notes (Signed)
Report called to [facility] and given to [place], [place].

## 2013-02-26 NOTE — Progress Notes (Signed)
D.  Pt flat but pleasant on approach.  Denied complaints other than anxiety at this time.  Positive for evening AA group.  Pt is on contact precautions due to MRSA in nasal swab.  Also it was noted she had a scab on her left thigh that she stated was "staph" during admission process.  Pt was instructed with re-enforcement regarding proper hand washing procedures.  Denies SI/HI/hallucinations at this time.  A.  Support and encouragement offered.  Medications given as ordered for anxiety.  R.  Pt remains safe on unit, in dayroom interacting with peers.  Will continue to monitor.

## 2013-02-26 NOTE — Progress Notes (Signed)
Patient asking questions about care and condition. Deferred to physician. Prior evening 10/25, patient appeared to be agitated and anxious on telephone with boyfriend. Patient asking for something to calm her down. Patient asks nurse to give boyfriend directions on how to get to patient's room. Secured sitter for patient per previous progress notes. Patient requests her step-father's laptop to be sent to her. Deferred to physician for orders. Ativan given per CIWWA scale. Patient slept remainder of night.

## 2013-02-26 NOTE — Progress Notes (Signed)
Joanna Deleon to be D/C'd Behavioral Health per MD order.  Discussed with the patient and all questions fully answered.    Medication List    STOP taking these medications       ibuprofen 200 MG tablet  Commonly known as:  ADVIL,MOTRIN      TAKE these medications       multivitamin with minerals Tabs tablet  Take 1 tablet by mouth daily.        VVS, Skin clean, no evidence of skin break down, no evidence of skin tears noted. IV catheter discontinued intact. Site without signs and symptoms of complications. Dressing and pressure applied.  An After Visit Summary was printed and given to the patient. Patient escorted via WC, and D/C to [facility] via Transport.   Aldean Ast 02/26/2013 1:42 PM

## 2013-02-26 NOTE — Progress Notes (Signed)
Patient ID: Joanna Deleon, female   DOB: 1985/07/02, 27 y.o.   MRN: [id number] Nursing admit note- Patient is a 27y/o s/w/f admitted voluntarily as a transfer from [facility] following medical clearance.  She presented requesting detox from etoh and cocaine.  Per her account, she uses "everything".  Is currently on probation and unemployed, having lost her job due to illicit drug use.  States she will reside with stepfather at discharge.  Patient was inpatient x2 days where she received IV vancomycin for active MRSA dx by nasal swab.   Presents cooperative but with minimal insight into recovery process.  Will not persue long term treatment.  No medical hx and denies pain.  Denies SI/HI/ A/V/H.  Oriented to unit and fluids offered.  POC and 15' checks initiated for safety.  Safety mainta ined.

## 2013-02-26 NOTE — Progress Notes (Signed)
Patient did attend the evening speaker AA meeting.  

## 2013-02-27 ENCOUNTER — Encounter (HOSPITAL_COMMUNITY): Payer: Self-pay | Admitting: Psychiatry

## 2013-02-27 DIAGNOSIS — F329 Major depressive disorder, single episode, unspecified: Secondary | ICD-10-CM | POA: Diagnosis present

## 2013-02-27 DIAGNOSIS — F1020 Alcohol dependence, uncomplicated: Secondary | ICD-10-CM

## 2013-02-27 DIAGNOSIS — F4310 Post-traumatic stress disorder, unspecified: Secondary | ICD-10-CM

## 2013-02-27 MED ORDER — ADULT MULTIVITAMIN W/MINERALS CH
1.0000 | ORAL_TABLET | Freq: Every day | ORAL | Status: DC
  Administered 2013-02-27 – 2013-02-28 (×2): 1 via ORAL
  Filled 2013-02-27 (×6): qty 1

## 2013-02-27 MED ORDER — ENSURE COMPLETE PO LIQD
237.0000 mL | Freq: Two times a day (BID) | ORAL | Status: DC
  Administered 2013-02-27 – 2013-03-01 (×6): 237 mL via ORAL

## 2013-02-27 NOTE — Tx Team (Signed)
Interdisciplinary Treatment Plan Update (Adult)  Date: 02/27/2013  Time Reviewed:  9:45 AM  Progress in Treatment: Attending groups: Yes Participating in groups:  Yes Taking medication as prescribed:  Yes Tolerating medication:  Yes Family/Significant othe contact made: CSW assessing Patient understands diagnosis:  Yes Discussing patient identified problems/goals with staff:  Yes Medical problems stabilized or resolved:  Yes Denies suicidal/homicidal ideation: Yes Issues/concerns per patient self-inventory:  Yes Other:  New problem(s) identified: N/A  Discharge Plan or Barriers: CSW assessing for appropriate referrals.    Reason for Continuation of Hospitalization: Anxiety Depression Medication Stabilization  Comments: N/A  Estimated length of stay: 3-5 days  For review of initial/current patient goals, please see plan of care.  Attendees: Patient:     Family:     Physician:  Dr. Lugo 02/27/2013 10:50 AM   Nursing:   Donna Shimp, RN 02/27/2013 10:50 AM   Clinical Social Worker:  Jazilyn Siegenthaler Horton, LCSW 02/27/2013 10:50 AM   Other: Jennifer Clark, RN case manager 02/27/2013 10:50 AM   Other:  Heather Smart, LCSWA 02/27/2013 10:50 AM   Other:  Aggie Nwoko, NP 02/27/2013 10:50 AM   Other:  Nancy Sutherland, RN 02/27/2013 10:53 AM   Other: Jennifer Clark, case manager 02/27/2013 10:53 AM   Other: Jan Wright, RN 02/27/2013 10:54 AM   Other:    Other:    Other:    Other:     Scribe for Treatment Team:   Horton, Desira Alessandrini Nicole, 02/27/2013 , 10:50 AM  

## 2013-02-27 NOTE — BHH Suicide Risk Assessment (Signed)
Suicide Risk Assessment  Admission Assessment     Nursing information obtained from:  Patient Demographic factors:  Caucasian Current Mental Status:  Self-harm thoughts Loss Factors:  Legal issues;Financial problems / change in socioeconomic status Historical Factors:  Family history of mental illness or substance abuse;Impulsivity Risk Reduction Factors:  Living with another person, especially a relative  CLINICAL FACTORS:   Depression:   Comorbid alcohol abuse/dependence Alcohol/Substance Abuse/Dependencies  COGNITIVE FEATURES THAT CONTRIBUTE TO RISK:  Closed-mindedness Polarized thinking Thought constriction (tunnel vision)    SUICIDE RISK:   Moderate:  Frequent suicidal ideation with limited intensity, and duration, some specificity in terms of plans, no associated intent, good self-control, limited dysphoria/symptomatology, some risk factors present, and identifiable protective factors, including available and accessible social support.  PLAN OF CARE: Supportive approach/coping skills/relapse prevention                               Reassess and address the co morbidities                               Identify detox needs and address  I certify that inpatient services furnished can reasonably be expected to improve the patient's condition.  Aaralynn Shepheard A 02/27/2013, 4:49 PM

## 2013-02-27 NOTE — Progress Notes (Signed)
D:Pt c/o headache and mild nausea. Pt has been in bed much of the morning only attending one group this morning.  A:Supported pt to discuss feelings. Offered encouragement and 15 minute checks. Gave Gingerale for nausea and tylenol for headache. R:Pt denies si and hi. Safety maintained on the unit.

## 2013-02-27 NOTE — Progress Notes (Signed)
Nutrition Brief Note  Patient identified on the Malnutrition Screening Tool (MST) Report.  Wt Readings from Last 10 Encounters:  02/25/13 125 lb (56.7 kg)  11/28/12 122 lb (55.339 kg)   BMI of 23.6 kg/(m^2). Patient meets criteria for normal weight based on current BMI.   Discussed intake PTA with patient and compared to intake presently.  Discussed changes in intake, if any, and encouraged adequate intake of meals and snacks. Admitted for alcohol and cocaine detox. Reported drinking 12 pack/day and drinking heavy since she was 45 years old.   Met with pt who c/o nausea that started today. States it occurs on/off. Had ginger-ale on table near her bed. States PTA she was eating 2-3 meals/day with good appetite and stable weight. Reports eating less today at both meals due to nausea.    Current diet order is regular and pt is also offered choice of unit snacks mid-morning and mid-afternoon.  Pt is eating as desired.   Labs and medications reviewed. AST/ALT elevated.   Nutrition Dx:  Inadequate oral intake related to nausea as evidenced by pt report  Interventions:   Discussed the importance of nutrition and encouraged intake of food and beverages.     Educated pt on bland diet to help with nausea, pt expressed understanding   Supplements: Ensure Complete BID   Multivitamin 1 tablet PO daily  No additional nutrition interventions warranted at this time. If nutrition issues arise, please consult RD.   Levon Hedger MS, RD, LDN [phone] Pager [phone] After Hours Pager

## 2013-02-27 NOTE — Progress Notes (Signed)
During the 2345 check, pt was in bed and appeared startled when I opened her door. Pt verified that she was all right and this writer reminded her that she would be checked on every fifteen minutes.  Right after the check a female pt was seen leaving her room. Pt was reminded that there are to be no patients female or female allowed in her room.  Pt was made aware that there are cameras on the halls recording at all times but there are none in her room. If something bad was to happen it would only be her word against another patients. Pt was encouraged to think of her well being and reminded that the hospital has rules in place and are being enforced for pt's protection.  Pt responded with a nod and ok but didn't provide any explanation.Marland Kitchen

## 2013-02-27 NOTE — Progress Notes (Signed)
Psychoeducational Group Note  Date:  02/27/2013 Time: 1100  Group Topic/Focus:  Self Care:   The focus of this group is to help patients understand the importance of self-care in order to improve or restore emotional, physical, spiritual, interpersonal, and financial health.  Participation Level: Did Not Attend  Participation Quality:  Not Applicable  Affect:  Not Applicable  Cognitive:  Not Applicable  Insight:  Not Applicable  Engagement in Group: Not Applicable  Additional Comments:  [place] did not attend group, patient remained in bed.  Karleen Hampshire Brittini 02/27/2013, 6:39 PM
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
## 2013-02-27 NOTE — Progress Notes (Signed)
Pt was found to have female Pt in room by MHT.  MHT spoke with both Pt and female Pt at length (see MHT note).  This writer went down to speak with Pt and Pt denied knowing that female Pt was in her room at all.  Pt states "I was asleep".  Pt had however been noted sitting close with female Pt in dayroom earlier.  Will continue to closely monitor.
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
## 2013-02-27 NOTE — [facility] Group Notes (Signed)
[facility] LCSW Group Therapy  02/27/2013  1:15 PM   Type of Therapy:  Group Therapy  Participation Level:  Did Not Attend  Sandeep Delagarza Horton, LCSW 02/27/2013 2:38 PM   

## 2013-02-27 NOTE — [facility] Group Notes (Signed)
[facility] LCSW Aftercare Discharge Planning Group Note   02/27/2013 8:45 AM  Participation Quality:  Did Not Attend - was pulled and with the doctor for the duration of group  Reyes Ivan, LCSW 02/27/2013 9:37 AM

## 2013-02-27 NOTE — Discharge Summary (Signed)
  Date: 02/27/2013  Patient name: Joanna Deleon  Medical record number: [id number]  Date of birth: 09/17/1985   This patient has been seen and the plan of care was discussed with the house staff. Please see their note for complete details. I concur with their findings and plan.  Jonah Blue, DO, FACP Faculty [facility] Medicine Residency Program 02/27/2013, 9:09 PM

## 2013-02-27 NOTE — Progress Notes (Signed)
Pt attended AA group 

## 2013-02-28 DIAGNOSIS — F39 Unspecified mood [affective] disorder: Secondary | ICD-10-CM

## 2013-02-28 DIAGNOSIS — F321 Major depressive disorder, single episode, moderate: Secondary | ICD-10-CM

## 2013-02-28 NOTE — [facility] Group Notes (Signed)
[facility] LCSW Group Therapy  02/28/2013 3:20 PM  Type of Therapy:  Group Therapy  Participation Level:  Minimal  Participation Quality:  Inattentive  Affect:  Blunted  Cognitive:  Lacking  Insight:  Limited  Engagement in Therapy:  Lacking  Modes of Intervention:  Education, Socialization and Support  Summary of Progress/Problems: MHA Speaker came to talk about his personal journey with substance abuse and addiction. The pt processed ways by which to relate to the speaker. MHA speaker provided handouts and educational information pertaining to groups and services offered by the [facility]. Joanna Deleon was inattentive and disengaged throughout today's group (guest speaker). At this time, Joanna Deleon does not show progress in the group setting AEB her inability to remain attentive and actively listen during the speaker's presentation. Joanna Deleon left group after about 30 minutes and did not return.    Smart, Giannamarie Paulus 02/28/2013, 3:20 PM

## 2013-02-28 NOTE — Progress Notes (Signed)
The focus of this group is to educate the patient on the purpose and policies of crisis stabilization and provide a format to answer questions about their admission.  The group details unit policies and expectations of patients while admitted. Patient attended and but did not engage and patient stated " Im just waking up".
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
## 2013-02-28 NOTE — Progress Notes (Signed)
Patient ID: Joanna Deleon, female   DOB: 16-Sep-1985, 27 y.o.   MRN: [id number]  D: Patient presents with anxious affect and mood. Patient denies SI/HI and A/V hallucinations. Patient requested Trazodone to help her sleep. However, patient reported that she had slept all day because she, "didn't feel good." Patient is seen in the milieu tonight and pleasant with other patients and staff. Patient attended AA group this evening.   A: Encouragement and support given to patient. Patient encouraged to lay down after PRN Trazodone was given.   R: Patient is receptive and cooperative. Q15 minute checks are maintained for safety.

## 2013-02-28 NOTE — Progress Notes (Signed)
Recreation Therapy Notes  Date: 10.28.2014 Time: 2:30pm Location: [address] Dayroom  Group Topic: Software engineer Activities (AAA)  Behavioral Response: Engaged, Attentive, Appropriate   Affect: Euthymic  Clinical Observations/Feedback: Dog Team: [employer]. Patient interacted appropriately with peer, dog team, LRT and MHT.   Joanna Deleon, LRT/CTRS  Joanna Deleon 02/28/2013 4:59 PM

## 2013-02-28 NOTE — Progress Notes (Signed)
D: Pt in bed resting with eyes closed. Respirations even and unlabored. Pt appears to be in no signs of distress at this time. A: Q15min checks remains for this pt. R: Pt remains safe at this time.   

## 2013-02-28 NOTE — Progress Notes (Signed)
D: Patient denies SI/HI and A/V hallucinations; patient reports sleep is well; reports appetite is good; reports energy level is normal ; reports ability to pay attention is improving; rates depression as 1/10; rates hopelessness 1/10; patient reports no withdrawal symptoms  A: Monitored q 15 minutes; patient encouraged to attend groups; patient educated about medications; patient given medications per physician orders; patient encouraged to express feelings and/or concerns  R: Patient is appropriate and animated; patient reports just wanting to sleep; patient forwards little information; patient's interaction with staff and peers is appropriate;  patient is taking medications as prescribed and tolerating medications; patient is attending some groups but engaging very little
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
## 2013-02-28 NOTE — Progress Notes (Signed)
Adult Psychoeducational Group Note  Date:  02/28/2013 Time:  6:55 PM  Group Topic/Focus:  Recovery Goals:   The focus of this group is to identify appropriate goals for recovery and establish a plan to achieve them.  Participation Level:  Minimal  Participation Quality:  Attentive  Affect:  Appropriate  Cognitive:  Appropriate  Insight: Improving  Engagement in Group:  Developing/Improving  Modes of Intervention:  Discussion, Education, Exploration, Limit-setting, Socialization and Support  Additional Comments:  [place] attended group and shared during group. Patient explained what recovery means to patient. Patient was asked to complete workbook on My personal recovery goals, and wrote down two things that are standing between recovery and patient. Patient stated one goal to Clinical research associate as Clinical research associate wrote it down the white board. Patient also, wrote down and stated a goal in how to stay in a stage of recovery. Patient became distracted at times, talking with peers non related to group.  Karleen Hampshire Brittini 02/28/2013, 6:55 PM

## 2013-03-01 DIAGNOSIS — F1010 Alcohol abuse, uncomplicated: Secondary | ICD-10-CM

## 2013-03-01 DIAGNOSIS — F332 Major depressive disorder, recurrent severe without psychotic features: Secondary | ICD-10-CM

## 2013-03-01 DIAGNOSIS — F1910 Other psychoactive substance abuse, uncomplicated: Secondary | ICD-10-CM

## 2013-03-01 MED ORDER — TRAZODONE HCL 50 MG PO TABS: 50.0000 mg | ORAL_TABLET | Freq: Every evening | ORAL | Status: DC | PRN

## 2013-03-01 NOTE — Progress Notes (Signed)
(  Discharge tomorrow morning) [facility] Adult Case Management Discharge Plan :  Will you be returning to the same living situation after discharge: Yes,  pt can return to her step dad's house after completing further inpatient treatment At discharge, do you have transportation home?:Yes,  provided pt with bus pass and directions Do you have the ability to pay for your medications:Yes,  access to meds  Release of information consent forms completed and in the chart;  Patient's signature needed at discharge.  Patient to Follow up at: Follow-up Information   Follow up with [facility] On 03/02/2013. (Arrive at 8:00 am promptly for screening.  If deemed appropriate for the program you will be admitted at this time for further inpatient treatment.  Bring supply of medications and ID)    Contact information:   [address] [place], [place] [address]  Phone: [phone] Fax: [phone]      Follow up with [facility]. (Walk in clinic is Monday - Friday 8 am - 3 pm, for medication management and therapy)    Contact information:   [address], [place] [address] Phone: [phone] Fax: [phone]      Patient denies SI/HI:   Yes,  denies SI/HI    Safety Planning and Suicide Prevention discussed:  Yes,  discussed with pt and pt's step dad.  See suicide prevention education note.   Joanna Deleon 03/01/2013, 12:36 PM

## 2013-03-01 NOTE — [facility] Group Notes (Signed)
[facility] LCSW Group Therapy  03/01/2013  1:15 PM   Type of Therapy:  Group Therapy  Participation Level:  Active  Participation Quality:  Appropriate and Attentive  Affect:  Appropriate, Calm  Cognitive:  Alert and Appropriate  Insight:  Developing/Improving and Engaged  Engagement in Therapy:  Developing/Improving and Engaged  Modes of Intervention:  Clarification, Confrontation, Discussion, Education, Exploration, Limit-setting, Orientation, Problem-solving, Rapport Building, Dance movement psychotherapist, Socialization and Support  Summary of Progress/Problems: The topic for group today was emotional regulation.  This group focused on both positive and negative emotion identification and allowed group members to process ways to identify feelings, regulate negative emotions, and find healthy ways to manage internal/external emotions. Group members were asked to reflect on a time when their reaction to an emotion led to a negative outcome and explored how alternative responses using emotion regulation would have benefited them. Group members were also asked to discuss a time when emotion regulation was utilized when a negative emotion was experienced. Pt states that she holds her emotions in until she explodes, lashing out on people, and in the past cutting and suicide attempts.  Pt states that she also started coping negatively by drinking.  Pt states that talking in the groups and having the support here have been helpful for her.  Pt states that now that she has detoxed she has a clearer head and is confident about going for further treatment.  Pt actively participated and was engaged in group discussion.    Reyes Ivan, LCSW 03/01/2013 2:31 PM

## 2013-03-01 NOTE — Discharge Summary (Signed)
Physician Discharge Summary Note  Patient:  Joanna Deleon is an 27 y.o., female MRN:  [id number] DOB:  March 07, 1986 Patient phone:  [phone] (home)  Patient address:   [address] 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 Bipolar 1 disorder   . Depression    AXIS IV:  economic problems, other psychosocial or environmental problems, problems related to social environment and problems with primary support group AXIS V:  61-70 mild symptoms  Level of Care:  [facility]  Hospital Course:  On admission:  27 Y/O female who states she "was on alcohol really bad," was tired of it. staes she put herself at [facility] for detox. Drinking a 12 pack every one or two days.About a year or two. Used to drink about a beer or two a day. States that due to her living situation, going around being, homeless made the depression worst what triggered her increased consumption. . Kept losing people in her family. Lost grandparents, father kept getting sick, suffering from alcoholism. Started drinking around 43, has tried about everything Crack. Marijuana, Meth, Opioids but alcohol has been her main drug  During hospitalization:  Librium alcohol protocol implemented successfully.  Trazodone 50 mg for sleep issues ordered.  [place] attended and participated in therapy.  She denied suicidal/homicidal ideations and auditory/visual hallucinations, follow-up appointments  encouraged to attend, outside support groups encouraged and information given, Rx and 14 day supply of medications given for [facility] rehab center.  [place] is mentally and physically stable for discharge.  Consults:  None  Significant Diagnostic Studies:  labs: completed, reviewed, stable  Discharge Vitals:   Blood pressure 102/70, pulse 102, temperature 97.5 F (36.4 C), temperature source Oral, resp. rate 20, last menstrual period 01/19/2013, SpO2 100.00%. There is no weight on file to calculate BMI. Lab Results:   No results found for this or any previous visit (from the past 72 hour(s)).  Physical Findings: AIMS: Facial and Oral Movements Muscles of Facial Expression: None, normal Lips and Perioral Area: None, normal Jaw: None, normal Tongue: None, normal,Extremity Movements Upper (arms, wrists, hands, fingers): None, normal Lower (legs, knees, ankles, toes): None, normal, Trunk Movements Neck, shoulders, hips: None, normal, Overall Severity Severity of abnormal movements (highest score from questions above): None, normal Incapacitation due to abnormal movements: None, normal Patient's awareness of abnormal movements (rate only patient's report): No Awareness, Dental Status Current problems with teeth and/or dentures?: Yes (decayed teeth) Does patient usually wear dentures?: No  CIWA:  CIWA-Ar Total: 5 COWS:     Psychiatric Specialty Exam: See Psychiatric Specialty Exam and Suicide Risk Assessment completed by Attending Physician prior to discharge.  Discharge destination:  [facility]  Is patient on multiple antipsychotic therapies at discharge:  No   Has Patient had three or more failed trials of antipsychotic monotherapy by history:  No  Recommended Plan for Multiple Antipsychotic Therapies: NA  Discharge Orders   Future Orders Complete By Expires   Diet - low sodium heart  healthy  As directed    Increase activity slowly  As directed        Medication  List    STOP taking these medications       multivitamin with minerals Tabs tablet      TAKE these medications     Indication   traZODone 50 MG tablet  Commonly known as:  DESYREL  Take 1 tablet (50 mg total) by mouth at bedtime as needed for sleep.   Indication:  Trouble Sleeping           Follow-up Information   Follow up with [facility] On 03/02/2013. (Arrive at 8:00 am promptly for screening.  If deemed appropriate for the program you will be admitted at this time for further inpatient treatment.  Bring supply of medications and ID)    Contact information:   [address] [place], [place] [address]  Phone: [phone] Fax: [phone]      Follow up with [facility]. (Walk in clinic is Monday - Friday 8 am - 3 pm, for medication management and therapy)    Contact information:   [address], [place] [address] Phone: [phone] Fax: [phone]      Follow-up recommendations:  Activity:  as tolerated Diet:  low-sodium heart healthy diet Continue to work your relapse prevention plan Work on the life style changes that could help better manage your relapse prevention plan Comments:  Patient will continue her rehab at [facility] in the am.  Total Discharge Time:  Greater than 30 minutes.  Signed: Nanine Means, PMH-NP Reymundo Poll Dub Mikes, M.D. 03/01/2013, 11:09 AM

## 2013-03-01 NOTE — Progress Notes (Addendum)
Recreation Therapy Notes   Date: 10.28.2014 Time: 3:00pm Location: [address] Dayroom  Group Topic: Self-Esteem  Goal Area(s) Addresses:  Patient will identify positive ways to increase self-esteem. Patient will verbalize benefit of increased self-esteem.  Behavioral Response:  Monopolizing, Sharing  Intervention: Game  Activity: Self-esteem Spin the Bottle. LRT provided a empty water bottle with a + on one end and a - on the other. In turn patients spun the bottle, if the bottle stopped facing the patient showing a + sign patients were asked to state two strengths about themselves. If the bottle stopped facing the patient showing a - patients were asked to state two things they would like to work on.   Education:  Self-esteem, Discharge Planning.   Education Outcome: Acknowledges understanding  Clinical Observations/Feedback: Patient attended group, participating in activity until approximately 3:15pm. At this time patient exited group session without explanation. Patient returned to group session as it was ending. Patient was asked by peers to identify either her strengths or areas of improvement. Patient chose to identify areas of improvement. Patient stated superficial thing such as go to school and get a job and complete rehab. Patient did not relate these to her self-esteem.   Marykay Lex Sade Hollon, LRT/CTRS  Lajoya Dombek L 03/01/2013 12:21 PM

## 2013-03-01 NOTE — Progress Notes (Signed)
Adult Psychoeducational Group Note  Date:  03/01/2013 Time:  6:52 PM  Group Topic/Focus:  Personal Choices and Values:   The focus of this group is to help patients assess and explore the importance of values in their lives, how their values affect their decisions, how they express their values and what opposes their expression.  Participation Level:  Minimal  Participation Quality:  Redirectable  Affect:  Appropriate  Cognitive:  Appropriate  Insight: Improving  Engagement in Group:  Improving  Modes of Intervention:  Discussion, Education, Socialization and Support  Additional Comments:  [place] attended group and did not share. Patient completed worksheets in workbook with peers on identifying values and living a value oriented life. Patient did not share what values are important to patient and didn't state ways to improve choices that aren't healthy.    Karleen Hampshire Brittini 03/01/2013, 6:52 PM

## 2013-03-01 NOTE — [facility] Suicide Risk Assessment (Signed)
Suicide Risk Assessment  Discharge Assessment     Demographic Factors:  Caucasian  Mental Status Per Nursing Assessment::   On Admission:  Self-harm thoughts  Current Mental Status by Physician: In full contact with reality. There are no suicidal ideas, plans or intent. Her mood is euthymic, her affect is appropriate. There are no suicidal ideas, plans or intent. Her mood is euthymic, her affect is appropriate. She is willing and motivated to pursue further rehab work at [facility].  Loss Factors: NA  Historical Factors: NA  Risk Reduction Factors:   wanting to do better  Continued Clinical Symptoms:  Depression:   Comorbid alcohol abuse/dependence Alcohol/Substance Abuse/Dependencies  Cognitive Features That Contribute To Risk:  Closed-mindedness Polarized thinking Thought constriction (tunnel vision)    Suicide Risk:  Minimal: No identifiable suicidal ideation.  Patients presenting with no risk factors but with morbid ruminations; may be classified as minimal risk based on the severity of the depressive symptoms  Discharge Diagnoses:   AXIS I:  Alcohol Dependence, Depressive Disorder NOS AXIS II:  Deferred AXIS III:   Past Medical History  Diagnosis Date  . Bipolar 1 disorder   . Depression    AXIS IV:  housing problems, occupational problems and problems with primary support group AXIS V:  51-60 moderate symptoms  Plan Of Care/Follow-up recommendations:  Activity:  as tolerated Diet:  regular Follow up [facility] Continue to work your relapse prevention plan Is patient on multiple antipsychotic therapies at discharge:  No   Has Patient had three or more failed trials of antipsychotic monotherapy by history:  No  Recommended Plan for Multiple Antipsychotic Therapies: NA  Amillia Biffle A 03/01/2013, 7:51 PM

## 2013-03-01 NOTE — Progress Notes (Signed)
[facility] MD Progress Note  03/01/2013 11:15 AM Joanna Deleon  MRN:  [id number] Subjective:  Sleep is "good" still a little drowsy, appetite is "good", denies depression, anxiety "on the edge due to trying to figure things out"--discharge plans.  She plans to return to get her GED, go to AA and get a sponsor.  [facility] is scheduled for the am and then she will discharge to her step-dad who is supportive. Diagnosis:   DSM5:  Substance/Addictive Disorders:  Alcohol Related Disorder - Severe (303.90) Depressive Disorders:  Major Depressive Disorder - Mild (296.21)  Axis I: Alcohol Abuse, Substance Abuse and Substance Induced Mood Disorder Axis II: Deferred Axis III:  Past Medical History  Diagnosis Date  . Bipolar 1 disorder   . Depression    Axis IV: economic problems, other psychosocial or environmental problems, problems related to social environment and problems with primary support group Axis V: 41-50 serious symptoms  ADL's:  Intact  Sleep: Good  Appetite:  Good  Suicidal Ideation:  Denies  Homicidal Ideation:  Denies  Psychiatric Specialty Exam: Review of Systems  Constitutional: Negative.   HENT: Negative.   Eyes: Negative.   Respiratory: Negative.   Cardiovascular: Negative.   Gastrointestinal: Negative.   Genitourinary: Negative.   Musculoskeletal: Negative.   Skin: Negative.   Neurological: Negative.   Endo/Heme/Allergies: Negative.   Psychiatric/Behavioral: Positive for substance abuse.    Blood pressure 102/70, pulse 102, temperature 97.5 F (36.4 C), temperature source Oral, resp. rate 20, last menstrual period 01/19/2013, SpO2 100.00%.There is no weight on file to calculate BMI.  General Appearance: Casual  Eye Contact::  Fair  Speech:  Normal Rate  Volume:  Normal  Mood:  Anxious  Affect:  Congruent  Thought Process:  Coherent  Orientation:  Full (Time, Place, and Person)  Thought Content:  WDL  Suicidal Thoughts:  No  Homicidal Thoughts:  No   Memory:  Immediate;   Fair Recent;   Fair Remote;   Fair  Judgement:  Fair  Insight:  Lacking  Psychomotor Activity:  Normal  Concentration:  Fair  Recall:  Fair  Akathisia:  No  Handed:  Right  AIMS (if indicated):     Assets:  Resilience  Sleep:  Number of Hours: 6   Current Medications: Current Facility-Administered Medications  Medication Dose Route Frequency Provider Last Rate Last Dose  . acetaminophen (TYLENOL) tablet 650 mg  650 mg Oral Q6H PRN Rachael Fee, MD   650 mg at 02/27/13 1202  . alum & mag hydroxide-simeth (MAALOX/MYLANTA) 200-200-20 MG/5ML suspension 30 mL  30 mL Oral Q4H PRN Rachael Fee, MD      . chlordiazePOXIDE (LIBRIUM) capsule 25 mg  25 mg Oral Q6H PRN Rachael Fee, MD   25 mg at 02/28/13 1947  . feeding supplement (ENSURE COMPLETE) (ENSURE COMPLETE) liquid 237 mL  237 mL Oral BID BM Lavena Bullion, RD   237 mL at 02/28/13 1946  . magnesium hydroxide (MILK OF MAGNESIA) suspension 30 mL  30 mL Oral Daily PRN Rachael Fee, MD      . multivitamin with minerals tablet 1 tablet  1 tablet Oral Daily Lavena Bullion, RD   1 tablet at 02/28/13 0804  . nicotine polacrilex (NICORETTE) gum 2 mg  2 mg Oral PRN Rachael Fee, MD   2 mg at 02/27/13 2253  . traZODone (DESYREL) tablet 50 mg  50 mg Oral QHS PRN Rachael Fee, MD   50 mg at  02/28/13 2203    Lab Results: No results found for this or any previous visit (from the past 48 hour(s)).  Physical Findings: AIMS: Facial and Oral Movements Muscles of Facial Expression: None, normal Lips and Perioral Area: None, normal Jaw: None, normal Tongue: None, normal,Extremity Movements Upper (arms, wrists, hands, fingers): None, normal Lower (legs, knees, ankles, toes): None, normal, Trunk Movements Neck, shoulders, hips: None, normal, Overall Severity Severity of abnormal movements (highest score from questions above): None, normal Incapacitation due to abnormal movements: None, normal Patient's awareness of  abnormal movements (rate only patient's report): No Awareness, Dental Status Current problems with teeth and/or dentures?: Yes (decayed teeth) Does patient usually wear dentures?: No  CIWA:  CIWA-Ar Total: 5 COWS:     Treatment Plan Summary: Daily contact with patient to assess and evaluate symptoms and progress in treatment Medication management  Plan:  Review of chart, vital signs, medications, and notes. 1-Individual and group therapy 2-Medication management for depression and anxiety:  Medications reviewed with the patient and she stated no untoward effects, no changes made 3-Coping skills for depression and substance abuse developing 4-Continue crisis stabilization and management 5-Address health issues--monitoring vital signs, stable 6-Treatment plan in progress to prevent relapse of depression and alcohol abuse  Medical Decision Making Problem Points:  Established problem, stable/improving (1) and Review of psycho-social stressors (1) Data Points:  Review of medication regiment & side effects (2)  I certify that inpatient services furnished can reasonably be expected to improve the patient's condition.   Nanine Means, PMH-NP 03/01/2013, 11:15 AM Agree with assessment and plan Madie Reno A. Dub Mikes, M.D.

## 2013-03-01 NOTE — Tx Team (Signed)
Interdisciplinary Treatment Plan Update (Adult)  Date: 03/01/2013  Time Reviewed:  9:45 AM  Progress in Treatment: Attending groups: Yes Participating in groups:  Yes Taking medication as prescribed:  Yes Tolerating medication:  Yes Family/Significant othe contact made: Yes Patient understands diagnosis:  Yes Discussing patient identified problems/goals with staff:  Yes Medical problems stabilized or resolved:  Yes Denies suicidal/homicidal ideation: Yes Issues/concerns per patient self-inventory:  Yes Other:  New problem(s) identified: N/A  Discharge Plan or Barriers: Pt will follow up at [facility] for further inpatient treatment and [facility] for medication management and therapy.    Reason for Continuation of Hospitalization: Anxiety Depression Medication Stabilization  Comments: N/A  Estimated length of stay: 1 day, d/c tomorrow  For review of initial/current patient goals, please see plan of care.  Attendees: Patient:     Family:     Physician:   03/01/2013 10:40 AM   Nursing:   Roswell Miners, RN 03/01/2013 10:40 AM   Clinical Social Worker:  Reyes Ivan, LCSW 03/01/2013 10:40 AM   Other: Onnie Boer, RN case manager 03/01/2013 10:40 AM   Other:  Trula Slade, LCSWA 03/01/2013 10:40 AM   Other:  Burnetta Sabin, RN 03/01/2013 10:40 AM   Other:  Nanine Means, NP 03/01/2013 10:41 AM   Other:    Other:    Other:    Other:    Other:    Other:     Scribe for Treatment Team:   Carmina Miller, 03/01/2013 , 10:40 AM

## 2013-03-01 NOTE — [facility] Group Notes (Signed)
[facility] LCSW Aftercare Discharge Planning Group Note   03/01/2013 8:45 AM  Participation Quality:  Alert and Appropriate   Mood/Affect:  Appropriate and Calm  Depression Rating:  0  Anxiety Rating:  2-3  Thoughts of Suicide:  Pt denies SI/HI  Will you contract for safety?   Yes  Current AVH:  Pt denies  Plan for Discharge/Comments:  Pt attended discharge planning group and actively participated in group.  CSW provided pt with today's workbook.  Pt reports feeling sleepy and groggy today but is happy about making a good decision to go for further treatment.  Pt is scheduled to go to [facility] for further inpatient treatment tomorrow morning.  Pt also has follow up scheduled for [facility] for medication management.  No further needs voiced by pt at this time.    Transportation Means: Pt reports access to transportation - will provide pt with a bus pass and directions to [facility]  Supports: No supports mentioned at this time  Reyes Ivan, LCSW 03/01/2013 9:55 AM

## 2013-03-01 NOTE — Progress Notes (Signed)
Patient ID: Joanna Deleon, female   DOB: 01-Aug-1985, 27 y.o.   MRN: [id number]  D: Patient is pleasant upon approach but presents with anxious mood and affect. Patient denies SI/HI and A/V hallucinations. Patient c/o anxiety and having trouble sleeping. Writer inquired about patient's anxiety and patient stated it was related to a phone call that the patient had just had with her ex boyfriend. Writer encouraged patient to think about how the patient felt after talking to the ex boyfriend and if the patient can think of alternative ways to handle the situation if it presents itself later.   A: Encouragement and support were given to patient to lay down after PRN Trazodone was given for sleep.  PRN Librium was given to patient for anxiety.  R: Patient was cooperative and receptive. Patient reports that the Trazodone, "helped me last night." Patient also has relief from anxiety after Librium is given. Patient is seen in the milieu and interacting with patient's pleasantly. Q15 minute safety checks are maintained and patient is safe at this time.

## 2013-03-02 LAB — CULTURE, BLOOD (ROUTINE X 2)

## 2013-03-02 NOTE — Progress Notes (Addendum)
Patient ID: Joanna Deleon, female   DOB: 09/18/1985, 27 y.o.   MRN: [id number]  Patient discharged and will continue treatment at [facility]. Patient denies pain. Patient denies SI/HI and A/V hallucinations. Patient belongings returned. Discharge instructions and medications were explained to patient. Patient verbalized understanding of both medications and discharge instructions. Pt. Showed no signs of distress upon discharge from [facility]. Pt had no questions upon departure. Writer gave patient breakfast and patient satisfaction survey. Patient filled out and returned patient satisfaction survey.

## 2013-03-02 NOTE — Progress Notes (Signed)
Patient ID: Joanna Deleon, female   DOB: December 23, 1985, 27 y.o.   MRN: [id number]  D: Patient presents with anxious mood and affect. Pt denies SI/HI and A/V hallucinations. Patient c/o feeling anxious about tomorrow (discharge) and requested medication.   A: PRN Librium given to patient per physician's orders to relieve patient's anxiety. Writer gave emotional support and encouragement to patient.   R: Patient receptive and cooperative with Clinical research associate. Patient reports relief from anxiety.  Patient pleasant with staff and other patient's. Patient attended evening group and is seen in the milieu throughout the evening. Q15 minute safety checks are maintained and patient is safe at this time.

## 2013-03-03 ENCOUNTER — Encounter (HOSPITAL_BASED_OUTPATIENT_CLINIC_OR_DEPARTMENT_OTHER): Payer: Self-pay | Admitting: Emergency Medicine

## 2013-03-03 ENCOUNTER — Emergency Department (HOSPITAL_BASED_OUTPATIENT_CLINIC_OR_DEPARTMENT_OTHER)
Admission: EM | Admit: 2013-03-03 | Discharge: 2013-03-03 | Disposition: A | Discharge status: Home / Self Care | Payer: Self-pay | Attending: Emergency Medicine | Admitting: Emergency Medicine

## 2013-03-03 DIAGNOSIS — R21 Rash and other nonspecific skin eruption: Secondary | ICD-10-CM | POA: Insufficient documentation

## 2013-03-03 DIAGNOSIS — F17200 Nicotine dependence, unspecified, uncomplicated: Secondary | ICD-10-CM | POA: Insufficient documentation

## 2013-03-03 DIAGNOSIS — Z8614 Personal history of Methicillin resistant Staphylococcus aureus infection: Secondary | ICD-10-CM | POA: Insufficient documentation

## 2013-03-03 DIAGNOSIS — L02419 Cutaneous abscess of limb, unspecified: Secondary | ICD-10-CM | POA: Insufficient documentation

## 2013-03-03 DIAGNOSIS — F319 Bipolar disorder, unspecified: Secondary | ICD-10-CM | POA: Insufficient documentation

## 2013-03-03 DIAGNOSIS — L0390 Cellulitis, unspecified: Secondary | ICD-10-CM

## 2013-03-03 HISTORY — DX: Alcohol abuse, uncomplicated: F10.10

## 2013-03-03 MED ORDER — CLINDAMYCIN HCL 150 MG PO CAPS: 300.0000 mg | ORAL_CAPSULE | Freq: Four times a day (QID) | ORAL | Status: DC

## 2013-03-03 NOTE — ED Notes (Signed)
Possible abscess to left upper leg

## 2013-03-03 NOTE — ED Provider Notes (Addendum)
CSN: [id number]     Arrival date & time 03/03/13  1450 History   First MD Initiated Contact with Patient 03/03/13 1458     Chief Complaint  Patient presents with  . Abscess    left upper leg   (Consider location/radiation/quality/duration/timing/severity/associated sxs/prior Treatment) Patient is a 27 y.o. female presenting with rash.  Rash Location:  Leg Leg rash location:  L upper leg Quality: itchiness, painful and redness   Pain details:    Severity:  Moderate   Onset quality:  Gradual   Duration:  1 day   Timing:  Constant   Progression:  Worsening Severity:  Moderate Onset quality:  Sudden Duration:  1 day Timing:  Constant Progression:  Worsening Chronicity:  Recurrent Context comment:  Possibly bitten by spider Relieved by:  Nothing Worsened by:  Nothing tried Ineffective treatments:  None tried Associated symptoms: no fever     Past Medical History  Diagnosis Date  . Bipolar 1 disorder   . Depression   . Alcohol abuse    Past Surgical History  Procedure Laterality Date  . Detox      ETOH   No family history on file. History  Substance Use Topics  . Smoking status: Current Every Day Smoker -- 1.00 packs/day    Types: Cigarettes  . Smokeless tobacco: Never Used  . Alcohol Use: No     Comment: rarely   OB History   Grav Para Term Preterm Abortions TAB SAB Ect Mult Living                 Review of Systems  Constitutional: Negative for fever.  Skin: Positive for rash.  All other systems reviewed and are negative.    Allergies  Review of patient's allergies indicates no known allergies.  Home Medications   Current Outpatient Rx  Name  Route  Sig  Dispense  Refill  . traZODone (DESYREL) 50 MG tablet   Oral   Take 1 tablet (50 mg total) by mouth at bedtime as needed for sleep.   30 tablet   0    BP 110/53  Pulse 98  Temp(Src) 98.6 F (37 C) (Oral)  Resp 20  Wt 133 lb (60.328 kg)  BMI 25.14 kg/m2  SpO2 99%  LMP  01/29/2013 Physical Exam  Nursing note and vitals reviewed. Constitutional: She is oriented to person, place, and time. She appears well-developed and well-nourished. No distress.  HENT:  Head: Normocephalic and atraumatic.  Eyes: Conjunctivae are normal. No scleral icterus.  Neck: Neck supple.  Cardiovascular: Normal rate and intact distal pulses.   Pulmonary/Chest: Effort normal. No stridor. No respiratory distress.  Abdominal: Normal appearance. She exhibits no distension.  Neurological: She is alert and oriented to person, place, and time.  Skin: Skin is warm and dry. No rash noted.     Psychiatric: She has a normal mood and affect. Her behavior is normal.    ED Course  Procedures (including critical care time) Labs Review Labs Reviewed - No data to display Imaging Review No results found.  EKG Interpretation   None       MDM   1. Cellulitis    27 yo female with prior MRSA skin infections, including abscesses requiring incision and drainage, presenting with a lesion on her left leg that started off this morning as itchy and has now progressed to pain with spreading redness.  Area small, but given reported history of MRSA abscesses, will treat with clindamycin.  Exam not concerning  for abscess, does not require drainage.     Candyce Churn, MD 03/03/13 1520  Candyce Churn, MD 03/03/13 1520

## 2013-03-06 NOTE — Progress Notes (Signed)
Patient Discharge Instructions:  After Visit Summary (AVS):   Faxed to:  03/06/13 Discharge Summary Note:   Faxed to:  03/06/13 Psychiatric Admission Assessment Note:   Faxed to:  03/06/13 Suicide Risk Assessment - Discharge Assessment:   Faxed to:  03/06/13 Faxed/Sent to the Next Level Care provider:  03/06/13 Faxed to [facility] @ [phone] Faxed to [facility] @ [phone]  Jerelene Redden, 03/06/2013, 2:52 PM

## 2013-03-10 ENCOUNTER — Telehealth: Payer: Self-pay | Admitting: Internal Medicine

## 2013-03-10 NOTE — Telephone Encounter (Signed)
Joanna Deleon was seen in the hospital for alcohol withdrawal, discharged on 02/23/13 and transferred to mental health for management of Bipolar and depression. Called her number listed today to schedule a hospital follow up appointment for her to discuss her recent results of Hepatitis C +ve. No one has answered 2-3 times previously. Today, her father answered and said she has been at the [facility]. Called there but was unable to speak with anyone until Monday. Will attempt to call back next week.

## 2013-03-22 ENCOUNTER — Encounter (HOSPITAL_BASED_OUTPATIENT_CLINIC_OR_DEPARTMENT_OTHER): Payer: Self-pay | Admitting: Emergency Medicine

## 2013-03-22 ENCOUNTER — Emergency Department (HOSPITAL_BASED_OUTPATIENT_CLINIC_OR_DEPARTMENT_OTHER)
Admission: EM | Admit: 2013-03-22 | Discharge: 2013-03-22 | Disposition: A | Discharge status: Home / Self Care | Payer: Medicaid Other | Attending: Emergency Medicine | Admitting: Emergency Medicine

## 2013-03-22 DIAGNOSIS — M7918 Myalgia, other site: Secondary | ICD-10-CM

## 2013-03-22 DIAGNOSIS — M549 Dorsalgia, unspecified: Secondary | ICD-10-CM | POA: Insufficient documentation

## 2013-03-22 DIAGNOSIS — Z3202 Encounter for pregnancy test, result negative: Secondary | ICD-10-CM | POA: Insufficient documentation

## 2013-03-22 DIAGNOSIS — F3289 Other specified depressive episodes: Secondary | ICD-10-CM | POA: Insufficient documentation

## 2013-03-22 DIAGNOSIS — F17200 Nicotine dependence, unspecified, uncomplicated: Secondary | ICD-10-CM | POA: Insufficient documentation

## 2013-03-22 DIAGNOSIS — R51 Headache: Secondary | ICD-10-CM | POA: Insufficient documentation

## 2013-03-22 DIAGNOSIS — R6883 Chills (without fever): Secondary | ICD-10-CM | POA: Insufficient documentation

## 2013-03-22 DIAGNOSIS — F329 Major depressive disorder, single episode, unspecified: Secondary | ICD-10-CM | POA: Insufficient documentation

## 2013-03-22 LAB — URINALYSIS, ROUTINE W REFLEX MICROSCOPIC
Glucose, UA: NEGATIVE mg/dL
Ketones, ur: NEGATIVE mg/dL
Nitrite: NEGATIVE
Specific Gravity, Urine: 1.012 (ref 1.005–1.030)
Urobilinogen, UA: 0.2 mg/dL (ref 0.0–1.0)
pH: 7.5 (ref 5.0–8.0)

## 2013-03-22 LAB — URINE MICROSCOPIC-ADD ON

## 2013-03-22 LAB — PREGNANCY, URINE: Preg Test, Ur: NEGATIVE

## 2013-03-22 MED ORDER — IBUPROFEN 600 MG PO TABS: 600.0000 mg | ORAL_TABLET | Freq: Four times a day (QID) | ORAL | Status: DC | PRN

## 2013-03-22 MED ORDER — KETOROLAC TROMETHAMINE 60 MG/2ML IM SOLN
60.0000 mg | Freq: Once | INTRAMUSCULAR | Status: AC
  Administered 2013-03-22: 60 mg via INTRAMUSCULAR
  Filled 2013-03-22: qty 2

## 2013-03-22 NOTE — ED Provider Notes (Signed)
CSN: [id number]     Arrival date & time 03/22/13  1425 History   First MD Initiated Contact with Patient 03/22/13 1427     Chief Complaint  Patient presents with  . Back Pain   (Consider location/radiation/quality/duration/timing/severity/associated sxs/prior Treatment) Patient is a 27 y.o. female presenting with back pain.  Back Pain Associated symptoms: no abdominal pain, no chest pain, no dysuria, no fever and no headaches     26 year old female patient of [facility] here with a 3-4 days of bilateral mid back pain and chills for 2 days. She describes her back pain is bilateral dull achy pain at the 5-6/10 with periods of sharp pain that are 10 out of 10. Today she states that her pain is a 7-8/10 and constant. The pain is exacerbated by deep breath or twisting. Son notes mild headache and chills for the last 2 days. She has never had a UTI, and denies appetite change, poor by mouth intake, overt fever. She also denies bowel or bladder dysfunction, saddle anesthesia, or lower extremity numbness or weakness.  Past Medical History  Diagnosis Date  . Bipolar 1 disorder   . Depression   . Alcohol abuse    Past Surgical History  Procedure Laterality Date  . Detox      ETOH   History reviewed. No pertinent family history. History  Substance Use Topics  . Smoking status: Current Every Day Smoker -- 1.00 packs/day    Types: Cigarettes  . Smokeless tobacco: Never Used  . Alcohol Use: No     Comment: rarely   OB History   Grav Para Term Preterm Abortions TAB SAB Ect Mult Living                 Review of Systems  Constitutional: Positive for chills. Negative for fever and appetite change.  HENT: Negative for sore throat.   Respiratory: Negative for cough and shortness of breath.   Cardiovascular: Negative for chest pain.  Gastrointestinal: Negative for abdominal pain.  Genitourinary: Negative for dysuria.  Musculoskeletal: Positive for back pain.  Neurological:  Negative for headaches.  All other systems reviewed and are negative.    Allergies  Review of patient's allergies indicates no known allergies.  Home Medications   Current Outpatient Rx  Name  Route  Sig  Dispense  Refill  . ibuprofen (ADVIL) 600 MG tablet   Oral   Take 1 tablet (600 mg total) by mouth every 6 (six) hours as needed.   30 tablet   0   . traZODone (DESYREL) 50 MG tablet   Oral   Take 1 tablet (50 mg total) by mouth at bedtime as needed for sleep.   30 tablet   0    BP 107/55  Pulse 70  Temp(Src) 98.3 F (36.8 C) (Oral)  Resp 16  Ht 5\' 1"  (1.549 m)  Wt 137 lb (62.143 kg)  BMI 25.90 kg/m2  SpO2 100%  LMP 03/20/2013 Physical Exam  Nursing note and vitals reviewed. Constitutional: She is oriented to person, place, and time. She appears well-developed and well-nourished. No distress.  HENT:  Head: Normocephalic and atraumatic.  Eyes: EOM are normal. Pupils are equal, round, and reactive to light.  Neck: Neck supple.  Cardiovascular: Normal rate, regular rhythm and normal heart sounds.   No murmur heard. Pulmonary/Chest: Effort normal and breath sounds normal.  Abdominal: Soft. Bowel sounds are normal. There is tenderness in the suprapubic area. There is no rigidity and no guarding.  Musculoskeletal:  She exhibits no edema.  Soft tissue tenderness to palpation of equally on both sides of spine in thoracic/flank region.   Neurological: She is alert and oriented to person, place, and time.  Skin: Skin is warm and dry. She is not diaphoretic.  Psychiatric: She has a normal mood and affect.    ED Course  Procedures (including critical care time) Labs Review Labs Reviewed  URINALYSIS, ROUTINE W REFLEX MICROSCOPIC - Abnormal; Notable for the following:    APPearance CLOUDY (*)    Leukocytes, UA TRACE (*)    All other components within normal limits  URINE MICROSCOPIC-ADD ON - Abnormal; Notable for the following:    Squamous Epithelial / LPF FEW (*)     All other components within normal limits  URINE CULTURE  PREGNANCY, URINE   Imaging Review No results found.  EKG Interpretation   None       MDM   1. Musculoskeletal pain    27 year old female here with back pain. On exam consistent with musculoskeletal strain without any red flags. Will treat supportively with IM Toradol, and give Rx for ibuprofen. Trace leukocytes on UA sent for culture.   Provided red flags for back pain, return for worsening pain      Elenora Gamma, MD 03/22/13 1531

## 2013-03-22 NOTE — ED Notes (Signed)
Pt is form [facility], here for c/o lower back pain x 3 days

## 2013-03-23 NOTE — ED Provider Notes (Signed)
This patient was seen in conjunction with the resident physician.  Recommendation is appropriate and accurate, reflecting my thoughts and evaluation as well.  On my exam the patient was in no distress, standing upright, speaking on the phone, in no great discomfort.  Gerhard Munch, MD 03/23/13 [phone]

## 2013-03-26 LAB — URINE CULTURE: Colony Count: >=100000

## 2013-11-05 ENCOUNTER — Encounter (HOSPITAL_COMMUNITY): Payer: Self-pay | Admitting: Emergency Medicine

## 2013-11-05 ENCOUNTER — Emergency Department (HOSPITAL_COMMUNITY)
Admission: EM | Admit: 2013-11-05 | Discharge: 2013-11-06 | Disposition: A | Discharge status: Home / Self Care | Payer: Self-pay | Attending: Emergency Medicine | Admitting: Emergency Medicine

## 2013-11-05 DIAGNOSIS — Z7289 Other problems related to lifestyle: Secondary | ICD-10-CM

## 2013-11-05 DIAGNOSIS — F10929 Alcohol use, unspecified with intoxication, unspecified: Secondary | ICD-10-CM | POA: Diagnosis present

## 2013-11-05 DIAGNOSIS — F329 Major depressive disorder, single episode, unspecified: Secondary | ICD-10-CM

## 2013-11-05 DIAGNOSIS — R45851 Suicidal ideations: Secondary | ICD-10-CM | POA: Insufficient documentation

## 2013-11-05 DIAGNOSIS — F10920 Alcohol use, unspecified with intoxication, uncomplicated: Secondary | ICD-10-CM

## 2013-11-05 DIAGNOSIS — F319 Bipolar disorder, unspecified: Secondary | ICD-10-CM | POA: Insufficient documentation

## 2013-11-05 DIAGNOSIS — R000 Tachycardia, unspecified: Secondary | ICD-10-CM | POA: Insufficient documentation

## 2013-11-05 DIAGNOSIS — F17200 Nicotine dependence, unspecified, uncomplicated: Secondary | ICD-10-CM | POA: Insufficient documentation

## 2013-11-05 DIAGNOSIS — F32A Depression, unspecified: Secondary | ICD-10-CM

## 2013-11-05 DIAGNOSIS — F919 Conduct disorder, unspecified: Secondary | ICD-10-CM | POA: Insufficient documentation

## 2013-11-05 DIAGNOSIS — F1910 Other psychoactive substance abuse, uncomplicated: Secondary | ICD-10-CM | POA: Insufficient documentation

## 2013-11-05 DIAGNOSIS — IMO0002 Reserved for concepts with insufficient information to code with codable children: Secondary | ICD-10-CM

## 2013-11-05 DIAGNOSIS — F1994 Other psychoactive substance use, unspecified with psychoactive substance-induced mood disorder: Secondary | ICD-10-CM

## 2013-11-05 LAB — COMPREHENSIVE METABOLIC PANEL
ALK PHOS: 68 U/L (ref 39–117)
ALT: 33 U/L (ref 0–35)
ANION GAP: 14 (ref 5–15)
AST: 35 U/L (ref 0–37)
Albumin: 3.9 g/dL (ref 3.5–5.2)
BUN: 4 mg/dL — AB (ref 6–23)
CO2: 25 meq/L (ref 19–32)
Calcium: 9.4 mg/dL (ref 8.4–10.5)
Chloride: 108 mEq/L (ref 96–112)
Creatinine, Ser: 0.93 mg/dL (ref 0.50–1.10)
GFR, EST NON AFRICAN AMERICAN: 84 mL/min — AB (ref >90)
GLUCOSE: 97 mg/dL (ref 70–99)
POTASSIUM: 3.9 meq/L (ref 3.7–5.3)
Sodium: 147 mEq/L (ref 137–147)
Total Protein: 7.9 g/dL (ref 6.0–8.3)

## 2013-11-05 LAB — CBC
HEMATOCRIT: 43.2 % (ref 36.0–46.0)
HEMOGLOBIN: 15.1 g/dL — AB (ref 12.0–15.0)
MCH: 30.5 pg (ref 26.0–34.0)
MCHC: 35 g/dL (ref 30.0–36.0)
MCV: 87.3 fL (ref 78.0–100.0)
Platelets: 222 10*3/uL (ref 150–400)
RBC: 4.95 MIL/uL (ref 3.87–5.11)
RDW: 13.6 % (ref 11.5–15.5)
WBC: 9.4 10*3/uL (ref 4.0–10.5)

## 2013-11-05 LAB — RAPID URINE DRUG SCREEN, HOSP PERFORMED
AMPHETAMINES: NOT DETECTED
BARBITURATES: NOT DETECTED
Benzodiazepines: NOT DETECTED
Cocaine: POSITIVE — AB
Opiates: NOT DETECTED
Tetrahydrocannabinol: POSITIVE — AB

## 2013-11-05 LAB — ETHANOL: Alcohol, Ethyl (B): 310 mg/dL — ABNORMAL HIGH (ref 0–11)

## 2013-11-05 LAB — SALICYLATE LEVEL: Salicylate Lvl: <2 mg/dL — ABNORMAL LOW (ref 2.8–20.0)

## 2013-11-05 LAB — ACETAMINOPHEN LEVEL

## 2013-11-05 MED ORDER — LORAZEPAM 1 MG PO TABS
0.0000 mg | ORAL_TABLET | Freq: Two times a day (BID) | ORAL | Status: DC
  Administered 2013-11-05: 1 mg via ORAL

## 2013-11-05 MED ORDER — NICOTINE 21 MG/24HR TD PT24
21.0000 mg | MEDICATED_PATCH | Freq: Every day | TRANSDERMAL | Status: DC
  Administered 2013-11-05 – 2013-11-06 (×2): 21 mg via TRANSDERMAL
  Filled 2013-11-05 (×2): qty 1

## 2013-11-05 MED ORDER — ZOLPIDEM TARTRATE 5 MG PO TABS
5.0000 mg | ORAL_TABLET | Freq: Every evening | ORAL | Status: DC | PRN
  Administered 2013-11-05: 5 mg via ORAL
  Filled 2013-11-05: qty 1

## 2013-11-05 MED ORDER — LORAZEPAM 1 MG PO TABS
0.0000 mg | ORAL_TABLET | Freq: Four times a day (QID) | ORAL | Status: DC
  Administered 2013-11-05: 2 mg via ORAL
  Filled 2013-11-05: qty 1
  Filled 2013-11-05: qty 2

## 2013-11-05 MED ORDER — ALUM & MAG HYDROXIDE-SIMETH 200-200-20 MG/5ML PO SUSP: 30.0000 mL | ORAL | Status: DC | PRN

## 2013-11-05 MED ORDER — IBUPROFEN 200 MG PO TABS
600.0000 mg | ORAL_TABLET | Freq: Three times a day (TID) | ORAL | Status: DC | PRN
  Administered 2013-11-05 (×2): 600 mg via ORAL
  Filled 2013-11-05 (×2): qty 3

## 2013-11-05 MED ORDER — ONDANSETRON HCL 4 MG PO TABS
4.0000 mg | ORAL_TABLET | Freq: Three times a day (TID) | ORAL | Status: DC | PRN
  Administered 2013-11-05: 4 mg via ORAL
  Filled 2013-11-05: qty 1
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
## 2013-11-05 NOTE — BH Assessment (Signed)
Received a call for an assessment. Spoke with [facility]umes,PA-C who reported that [facility] Pt has a history of self-injurious behaviors (cutting). Pt reported that she has attempted suicide in the past. Pt was partying tonight with friends drinking and doing cocaine. Assessment will be initiated.

## 2013-11-05 NOTE — Progress Notes (Signed)
Pt referral has been faxed to the following facilities with bed availability:  [facility]- per [facility] available [facility]eta [facility] available [facility]- beds available per Vassie Moselle[facility]- per Okey Regalarol beds available   The following facilities are at capacity: [facility]evelyn Ngolivia  [facility]- per Suzy Bouchardurt  [facility]- [facility] not accepting referrals until Mon 7.5  [facility]  [facility]  [facility]- [facility]  [facility] Ballobin  [facility]  [facility]  [facility]- per Corliss SkainsPat   [facility] Sprong Disposition [facility]
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
## 2013-11-05 NOTE — ED Provider Notes (Signed)
Medical screening examination/treatment/procedure(s) were performed by non-physician practitioner and as supervising physician I was immediately available for consultation/collaboration.   Toy BakerAnthony T Ferol Laiche, MD 11/05/13 [phone]

## 2013-11-05 NOTE — Consult Note (Signed)
Psychiatric supervisory review confirms that findings, diagnostic considerations, and therapeutic interventions for current stabilization plans final disposition to be determined with psychiatrist on duty.  Chauncey MannGlenn E. Jennings, MD

## 2013-11-05 NOTE — ED Notes (Signed)
shuvon NP  Into see

## 2013-11-05 NOTE — ED Notes (Signed)
Visitor in w/ pt 
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
## 2013-11-05 NOTE — ED Notes (Signed)
Up to the desk on the phone 

## 2013-11-05 NOTE — ED Notes (Signed)
Bed: WTR5 Expected date:  Expected time:  Means of arrival:  Comments: [employer] w/ IVC

## 2013-11-05 NOTE — ED Notes (Signed)
Dr Shela Commonsj and shuvon into see

## 2013-11-05 NOTE — ED Notes (Addendum)
Pt has one bag of belongings and a black purse that contains cosmetics and other miscellaneous items( no money).  Pt has been seen and wanded by security.

## 2013-11-05 NOTE — ED Notes (Signed)
IVC papers rescinded pt to be admitted for medication for biploar-pt aware

## 2013-11-05 NOTE — BH Assessment (Addendum)
Assessment completed. Consulted with Maryjean Mornharles Kober, PA-C who recommends that patient be re-evaluated by psychiatry in the morning. Antony MaduraKelly Humes, PA-C has been notified of the recommendations.

## 2013-11-06 ENCOUNTER — Encounter (HOSPITAL_COMMUNITY): Payer: Self-pay | Admitting: Registered Nurse

## 2013-11-06 DIAGNOSIS — F329 Major depressive disorder, single episode, unspecified: Secondary | ICD-10-CM

## 2013-11-06 DIAGNOSIS — F1994 Other psychoactive substance use, unspecified with psychoactive substance-induced mood disorder: Secondary | ICD-10-CM

## 2013-11-06 DIAGNOSIS — F3289 Other specified depressive episodes: Secondary | ICD-10-CM

## 2013-11-06 DIAGNOSIS — F1910 Other psychoactive substance abuse, uncomplicated: Secondary | ICD-10-CM

## 2013-11-06 NOTE — Discharge Instructions (Signed)
Alcohol Use Disorder Alcohol use disorder is a mental disorder. It is not a one-time incident of heavy drinking. Alcohol use disorder is the excessive and uncontrollable use of alcohol over time that leads to problems with functioning in one or more areas of daily living. People with this disorder risk harming themselves and others when they drink to excess. Alcohol use disorder also can cause other mental disorders, such as mood and anxiety disorders, and serious physical problems. People with alcohol use disorder often misuse other drugs.  Alcohol use disorder is common and widespread. Some people with this disorder drink alcohol to cope with or escape from negative life events. Others drink to relieve chronic pain or symptoms of mental illness. People with a family history of alcohol use disorder are at higher risk of losing control and using alcohol to excess.  SYMPTOMS  Signs and symptoms of alcohol use disorder may include the following:   Consumption ofalcohol inlarger amounts or over a longer period of time than intended.  Multiple unsuccessful attempts to cutdown or control alcohol use.   A great deal of time spent obtaining alcohol, using alcohol, or recovering from the effects of alcohol (hangover).  A strong desire or urge to use alcohol (cravings).   Continued use of alcohol despite problems at work, school, or home because of alcohol use.   Continued use of alcohol despite problems in relationships because of alcohol use.  Continued use of alcohol in situations when it is physically hazardous, such as driving a car.  Continued use of alcohol despite awareness of a physical or psychological problem that is likely related to alcohol use. Physical problems related to alcohol use can involve the brain, heart, liver, stomach, and intestines. Psychological problems related to alcohol use include intoxication, depression, anxiety, psychosis, delirium, and dementia.   The need for  increased amounts of alcohol to achieve the same desired effect, or a decreased effect from the consumption of the same amount of alcohol (tolerance).  Withdrawal symptoms upon reducing or stopping alcohol use, or alcohol use to reduce or avoid withdrawal symptoms. Withdrawal symptoms include:  Racing heart.  Hand tremor.  Difficulty sleeping.  Nausea.  Vomiting.  Hallucinations.  Restlessness.  Seizures. DIAGNOSIS Alcohol use disorder is diagnosed through an assessment by your caregiver. Your caregiver may start by asking three or four questions to screen for excessive or problematic alcohol use. To confirm a diagnosis of alcohol use disorder, at least two symptoms (see SYMPTOMS) must be present within a 4045-month period. The severity of alcohol use disorder depends on the number of symptoms:  Mild--two or three.  Moderate--four or five.  Severe--six or more. Your caregiver may perform a physical exam or use results from lab tests to see if you have physical problems resulting from alcohol use. Your caregiver may refer you to a mental health professional for evaluation. TREATMENT  Some people with alcohol use disorder are able to reduce their alcohol use to low-risk levels. Some people with alcohol use disorder need to quit drinking alcohol. When necessary, mental health professionals with specialized training in substance use treatment can help. Your caregiver can help you decide how severe your alcohol use disorder is and what type of treatment you need. The following forms of treatment are available:   Detoxification. Detoxification involves the use of prescription medication to prevent alcohol withdrawal symptoms in the first week after quitting. This is important for people with a history of symptoms of withdrawal and for heavy drinkers who are  likely to have withdrawal symptoms. Alcohol withdrawal can be dangerous and, in severe cases, cause death. Detoxification is usually  provided in a hospital or in-patient substance use treatment facility.  Counseling or talk therapy. Talk therapy is provided by substance use treatment counselors. It addresses the reasons people use alcohol and ways to keep them from drinking again. The goals of talk therapy are to help people with alcohol use disorder find healthy activities and ways to cope with life stress, to identify and avoid triggers for alcohol use, and to handle cravings, which can cause relapse.  Medication.Different medications can help treat alcohol use disorder through the following actions:  Decrease alcohol cravings.  Decrease the positive reward response felt from alcohol use.  Produce an uncomfortable physical reaction when alcohol is used (aversion therapy).  Support groups. Support groups are run by people who have quit drinking. They provide emotional support, advice, and guidance. These forms of treatment are often combined. Some people with alcohol use disorder benefit from intensive combination treatment provided by specialized substance use treatment centers. Both inpatient and outpatient treatment programs are available. Document Released: 05/28/2004 Document Revised: 12/21/2012 Document Reviewed: 07/28/2012 [facility] Patient Information 2015 [place], [place]. This information is not intended to replace advice given to you by your health care provider. Make sure you discuss any questions you have with your health care provider.  Alcohol Intoxication Alcohol intoxication occurs when you drink enough alcohol that it affects your ability to function. It can be mild or very severe. Drinking a lot of alcohol in a short time is called binge drinking. This can be very harmful. Drinking alcohol can also be more dangerous if you are taking medicines or other drugs. Some of the effects caused by alcohol may include:  Loss of coordination.  Changes in mood and behavior.  Unclear thinking.  Trouble talking  (slurred speech).  Throwing up (vomiting).  Confusion.  Slowed breathing.  Twitching and shaking (seizures).  Loss of consciousness. HOME CARE  Do not drive after drinking alcohol.  Drink enough water and fluids to keep your pee (urine) clear or pale yellow. Avoid caffeine.  Only take medicine as told by your doctor. GET HELP IF:  You throw up (vomit) many times.  You do not feel better after a few days.  You frequently have alcohol intoxication. Your doctor can help decide if you should see a substance use treatment counselor. GET HELP RIGHT AWAY IF:  You become shaky when you stop drinking.  You have twitching and shaking.  You throw up blood. It may look bright red or like coffee grounds.  You notice blood in your poop (bowel movements).  You become lightheaded or pass out (faint). MAKE SURE YOU:   Understand these instructions.  Will watch your condition.  Will get help right away if you are not doing well or get worse. Document Released: 10/07/2007 Document Revised: 12/21/2012 Document Reviewed: 09/23/2012 [facility] Patient Information 2015 [place], [place]. This information is not intended to replace advice given to you by your health care provider. Make sure you discuss any questions you have with your health care provider.  Depression, Adult Depression is feeling sad, low, down in the dumps, blue, gloomy, or empty. In general, there are two kinds of depression:  Normal sadness or grief. This can happen after something upsetting. It often goes away on its own within 2 weeks. After losing a loved one (bereavement), normal sadness and grief may last longer than two weeks. It usually gets better  with time.  Clinical depression. This kind lasts longer than normal sadness or grief. It keeps you from doing the things you normally do in life. It is often hard to function at home, work, or at school. It may affect your relationships with others. Treatment is often  needed. GET HELP RIGHT AWAY IF:  You have thoughts about hurting yourself or others.  You lose touch with reality (psychotic symptoms). You may:  See or hear things that are not real.  Have untrue beliefs about your life or people around you.  Your medicine is giving you problems. MAKE SURE YOU:  Understand these instructions.  Will watch your condition.  Will get help right away if you are not doing well or get worse. Document Released: 05/23/2010 Document Revised: 01/13/2012 Document Reviewed: 08/20/2011 [facility] Patient Information 2015 [place], [place]. This information is not intended to replace advice given to you by your health care provider. Make sure you discuss any questions you have with your health care provider.

## 2013-11-06 NOTE — BHH Counselor (Signed)
Dr Lolly MustacheArfeen rescinded pt's IVC. Writer placed commitment reversal paperwork in IVC NB to be sent to [employer] [facility].  Evette Cristalaroline Paige Dione Petron, [place]WA Assessment Counselor

## 2013-11-06 NOTE — [facility] Suicide Risk Assessment (Cosign Needed)
Suicide Risk Assessment  Discharge Assessment     Demographic Factors:  Caucasian and Female  Total Time spent with patient: 30 minutes Psychiatric Specialty Exam:      Blood pressure 113/77, pulse 63, temperature 98.2 F (36.8 C), temperature source Oral, resp. rate 18, last menstrual period 10/06/2013, SpO2 99.00%.There is no weight on file to calculate BMI.   General Appearance: Casual and Disheveled   Eye Contact:: Good   Speech: Clear and Coherent and Normal Rate   Volume: Normal   Mood: Depressed   Affect: Congruent   Thought Process: Circumstantial and Goal Directed   Orientation: Full (Time, Place, and Person)   Thought Content: Rumination   Suicidal Thoughts: No   Homicidal Thoughts: No   Memory: Immediate; Good  Recent; Good  Remote; Good   Judgement: Fair   Insight: Present   Psychomotor Activity: Normal   Concentration: Fair   Recall: Good   Fund of Knowledge:Good   Language: Good   Akathisia: No   Handed: Right   AIMS (if indicated):   Assets: Communication Skills  Desire for Improvement   Sleep:   Musculoskeletal:  Strength & Muscle Tone: within normal limits  Gait & Station: normal  Patient leans: N/A    Mental Status Per Nursing Assessment::   On Admission:     Current Mental Status by Physician: Patient denies suicidal/homicidal ideation, psychosis, an paranoia  Loss Factors: NA  Historical Factors: Family history of mental illness or substance abuse  Risk Reduction Factors:   Positive social support  Continued Clinical Symptoms:  Depression  Cognitive Features That Contribute To Risk:  None noted    Suicide Risk:  Minimal: No identifiable suicidal ideation.  Patients presenting with no risk factors but with morbid ruminations; may be classified as minimal risk based on the severity of the depressive symptoms  Discharge Diagnoses:  AXIS I: Depressive Disorder NOS, Substance Abuse and Substance Induced Mood Disorder  AXIS II:  Deferred  AXIS III:  Past Medical History   Diagnosis  Date   .  Bipolar 1 disorder    .  Depression    .  Alcohol abuse     AXIS IV: other psychosocial or environmental problems  AXIS V: 41-50 serious symptoms   Plan Of Care/Follow-up recommendations:  Activity:  Resume usual activity Diet:  Resume usual diet  Is patient on multiple antipsychotic therapies at discharge:  No   Has Patient had three or more failed trials of antipsychotic monotherapy by history:  No  Recommended Plan for Multiple Antipsychotic Therapies: NA  Rankin, Shuvon, FNP-BC 11/06/2013, 1:12 PM

## 2013-11-06 NOTE — Progress Notes (Signed)
CSW provided patient with information on outpatient psychiatric services. Patient thanked CSW for assistance, has no further questions.  Joanna BruinKristin Lowell Mcgurk, MSW, [facility]ie County HospitalCSWA Clinical Social Worker [facility] [phone]
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
## 2014-05-13 ENCOUNTER — Emergency Department (HOSPITAL_COMMUNITY)
Admission: EM | Admit: 2014-05-13 | Discharge: 2014-05-14 | Disposition: A | Discharge status: Home / Self Care | Payer: Medicaid Other | Attending: Emergency Medicine | Admitting: Emergency Medicine

## 2014-05-13 ENCOUNTER — Encounter (HOSPITAL_COMMUNITY): Payer: Self-pay | Admitting: Emergency Medicine

## 2014-05-13 DIAGNOSIS — Z3202 Encounter for pregnancy test, result negative: Secondary | ICD-10-CM | POA: Insufficient documentation

## 2014-05-13 DIAGNOSIS — F1310 Sedative, hypnotic or anxiolytic abuse, uncomplicated: Secondary | ICD-10-CM | POA: Insufficient documentation

## 2014-05-13 DIAGNOSIS — Z720 Tobacco use: Secondary | ICD-10-CM | POA: Insufficient documentation

## 2014-05-13 DIAGNOSIS — F10220 Alcohol dependence with intoxication, uncomplicated: Secondary | ICD-10-CM | POA: Insufficient documentation

## 2014-05-13 DIAGNOSIS — F1410 Cocaine abuse, uncomplicated: Secondary | ICD-10-CM | POA: Insufficient documentation

## 2014-05-13 LAB — CBC
HCT: 45.4 % (ref 36.0–46.0)
HEMOGLOBIN: 15.7 g/dL — AB (ref 12.0–15.0)
MCH: 31.1 pg (ref 26.0–34.0)
MCHC: 34.6 g/dL (ref 30.0–36.0)
MCV: 89.9 fL (ref 78.0–100.0)
PLATELETS: 210 10*3/uL (ref 150–400)
RBC: 5.05 MIL/uL (ref 3.87–5.11)
RDW: 12.9 % (ref 11.5–15.5)
WBC: 14.2 10*3/uL — ABNORMAL HIGH (ref 4.0–10.5)

## 2014-05-13 LAB — SALICYLATE LEVEL: Salicylate Lvl: <4 mg/dL (ref 2.8–20.0)

## 2014-05-13 LAB — COMPREHENSIVE METABOLIC PANEL
ALT: 45 U/L — AB (ref 0–35)
ANION GAP: 6 (ref 5–15)
AST: 33 U/L (ref 0–37)
Albumin: 4.6 g/dL (ref 3.5–5.2)
Alkaline Phosphatase: 70 U/L (ref 39–117)
BUN: 9 mg/dL (ref 6–23)
CHLORIDE: 103 meq/L (ref 96–112)
CO2: 27 mmol/L (ref 19–32)
Calcium: 9 mg/dL (ref 8.4–10.5)
Creatinine, Ser: 0.66 mg/dL (ref 0.50–1.10)
GFR calc Af Amer: >90 mL/min (ref >90)
GFR calc non Af Amer: >90 mL/min (ref >90)
Glucose, Bld: 89 mg/dL (ref 70–99)
POTASSIUM: 3.9 mmol/L (ref 3.5–5.1)
Sodium: 136 mmol/L (ref 135–145)
TOTAL PROTEIN: 8.2 g/dL (ref 6.0–8.3)
Total Bilirubin: 0.5 mg/dL (ref 0.3–1.2)

## 2014-05-13 LAB — ETHANOL: Alcohol, Ethyl (B): 309 mg/dL — ABNORMAL HIGH (ref 0–9)

## 2014-05-13 LAB — ACETAMINOPHEN LEVEL: Acetaminophen (Tylenol), Serum: <10 ug/mL — ABNORMAL LOW (ref 10–30)

## 2014-05-13 MED ORDER — LORAZEPAM 1 MG PO TABS: 0.0000 mg | ORAL_TABLET | Freq: Two times a day (BID) | ORAL | Status: DC

## 2014-05-13 MED ORDER — LORAZEPAM 1 MG PO TABS: 0.0000 mg | ORAL_TABLET | Freq: Four times a day (QID) | ORAL | Status: DC

## 2014-05-13 NOTE — ED Notes (Signed)
Patient appears anxious, cooperative. Denies SI, HI, AVH. Rates anxiety 6/10. Depression 8/10. Reports "terrible" sleep in recent weeks with trouble both falling and staying asleep. States appetite has been poor. Estimates 10lb weight loss over last month.   Encouragement offered.  Q 15 safety checks in place.  Patient has power cord for ankle bracelet monitor in room at present.

## 2014-05-13 NOTE — ED Provider Notes (Signed)
CSN: [id number]     Arrival date & time 05/13/14  1913 History  This chart was scribed for non-physician practitioner, Earley Favor, FNP working with Rolland Porter, MD by Greggory Stallion, ED scribe. This patient was seen in room WTR4/WLPT4 and the patient's care was started at 9:34 PM.   Chief Complaint  Patient presents with  . Alcohol Problem   The history is provided by the patient. No language interpreter was used.    HPI Comments: Joanna Deleon is a 29 y.o. female with history of bipolar disorder and depression who presents to the Emergency Department requesting alcohol detox. Pt reports polysubstance abuse and states she would like to stop. She is requesting to go to behavioral health. Reports increased depression since alcohol and drug use. States she has never had treatment for her depression. She has history of cutting her wrists but states she hasn't done it in a long time. Denies SI/HI. Pt states she has no job or family to talk to because of her addiction. She also has no home and sleeps on the streets most nights. Reports her father is an alcoholic. LMP 4-5 weeks ago. States there is a chance she could be pregnant.   Past Medical History  Diagnosis Date  . Bipolar 1 disorder   . Depression   . Alcohol abuse    Past Surgical History  Procedure Laterality Date  . Detox      ETOH   History reviewed. No pertinent family history. History  Substance Use Topics  . Smoking status: Current Every Day Smoker -- 1.00 packs/day    Types: Cigarettes  . Smokeless tobacco: Never Used  . Alcohol Use: No     Comment: rarely   OB History    No data available     Review of Systems  Psychiatric/Behavioral: Negative for suicidal ideas.  All other systems reviewed and are negative.  Allergies  Review of patient's allergies indicates no known allergies.  Home Medications   Prior to Admission medications   Not on File   BP 91/67 mmHg  Pulse 116  Temp(Src) 97.6 F (36.4 C) (Oral)   Resp 18  Ht  (1.549 m)  Wt 130 lb (58.968 kg)  BMI 24.58 kg/m2  SpO2 96%  LMP 04/12/2014 (Approximate)   Physical Exam  Constitutional: She is oriented to person, place, and time. She appears well-developed and well-nourished. No distress.  HENT:  Head: Normocephalic and atraumatic.  Eyes: Conjunctivae and EOM are normal.  Neck: Neck supple. No tracheal deviation present.  Cardiovascular: Normal rate.   Pulmonary/Chest: Effort normal. No respiratory distress.  Musculoskeletal: Normal range of motion.  Neurological: She is alert and oriented to person, place, and time.  Skin: Skin is warm and dry.  Psychiatric: She has a normal mood and affect. Her behavior is normal.  Nursing note and vitals reviewed.   ED Course  Procedures (including critical care time)  DIAGNOSTIC STUDIES: Oxygen Saturation is 96% on RA, normal by my interpretation.    COORDINATION OF CARE: 9:39 PM-Discussed treatment plan which includes lab work and speaking with behavioral health with pt at bedside and pt agreed to plan.   Labs Review Labs Reviewed  ACETAMINOPHEN LEVEL - Abnormal; Notable for the following:    Acetaminophen (Tylenol), Serum <10.0 (*)    All other components within normal limits  CBC - Abnormal; Notable for the following:    WBC 14.2 (*)    Hemoglobin 15.7 (*)    All other components  within normal limits  COMPREHENSIVE METABOLIC PANEL - Abnormal; Notable for the following:    ALT 45 (*)    All other components within normal limits  ETHANOL - Abnormal; Notable for the following:    Alcohol, Ethyl (B) 309 (*)    All other components within normal limits  URINE RAPID DRUG SCREEN (HOSP PERFORMED) - Abnormal; Notable for the following:    Cocaine POSITIVE (*)    Benzodiazepines POSITIVE (*)    All other components within normal limits  SALICYLATE LEVEL  PREGNANCY, URINE  POC URINE PREG, ED  POC URINE PREG, ED    Imaging Review No results found.   EKG  Interpretation None       patient has been assessed by TTS.  [facility]  [facility] Final diagnoses:  Alcohol dependence with uncomplicated intoxication       I personally performed the services described in this documentation, which was scribed in my presence. The recorded information has been reviewed and is accurate.  Arman FilterGail K Mykenzi Vanzile, NP 05/13/14 [id number]  Arman FilterGail K Jamere Stidham, NP 05/14/14 [id number]  Rolland PorterMark James, MD 05/22/14 [phone]

## 2014-05-13 NOTE — BH Assessment (Signed)
Spoke with Earley FavorGail Schulz, NP prior to initiating assessment. Pt has BAL of 309 but is communicating and able to be assessed per Dondra SpryGail, NP. Pt is requesting help with detox. Denies SI/HI. Hx of cutting but has not done recently.   Clista BernhardtNancy Wandy Bossler, [facility] Triage Specialist 05/13/2014 9:57 PM

## 2014-05-13 NOTE — ED Notes (Addendum)
Pt arrived to the ED with a complaint of an alcohol and drug problem and addiction.  Pt wants to go to behavioral health and then [facility].  Pt denies HI/SI.
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
## 2014-05-13 NOTE — ED Notes (Signed)
[facility] Expected date: 05/13/14 Expected time:  Means of arrival:  Comments: Hold for [place].

## 2014-05-14 ENCOUNTER — Encounter (HOSPITAL_COMMUNITY): Payer: Self-pay | Admitting: Behavioral Health

## 2014-05-14 ENCOUNTER — Observation Stay (HOSPITAL_COMMUNITY)
Admission: AD | Admit: 2014-05-14 | Discharge: 2014-05-14 | Disposition: A | Discharge status: Home / Self Care | Payer: Medicaid Other | Source: Intra-hospital | Attending: Psychiatry | Admitting: Psychiatry

## 2014-05-14 DIAGNOSIS — F1010 Alcohol abuse, uncomplicated: Secondary | ICD-10-CM

## 2014-05-14 DIAGNOSIS — Y908 Blood alcohol level of 240 mg/100 ml or more: Secondary | ICD-10-CM | POA: Insufficient documentation

## 2014-05-14 DIAGNOSIS — F1014 Alcohol abuse with alcohol-induced mood disorder: Principal | ICD-10-CM | POA: Insufficient documentation

## 2014-05-14 DIAGNOSIS — F3130 Bipolar disorder, current episode depressed, mild or moderate severity, unspecified: Secondary | ICD-10-CM

## 2014-05-14 DIAGNOSIS — F1994 Other psychoactive substance use, unspecified with psychoactive substance-induced mood disorder: Secondary | ICD-10-CM

## 2014-05-14 DIAGNOSIS — F17210 Nicotine dependence, cigarettes, uncomplicated: Secondary | ICD-10-CM | POA: Insufficient documentation

## 2014-05-14 DIAGNOSIS — F419 Anxiety disorder, unspecified: Secondary | ICD-10-CM | POA: Insufficient documentation

## 2014-05-14 DIAGNOSIS — F319 Bipolar disorder, unspecified: Secondary | ICD-10-CM | POA: Diagnosis present

## 2014-05-14 DIAGNOSIS — F1914 Other psychoactive substance abuse with psychoactive substance-induced mood disorder: Secondary | ICD-10-CM | POA: Insufficient documentation

## 2014-05-14 DIAGNOSIS — F10129 Alcohol abuse with intoxication, unspecified: Secondary | ICD-10-CM | POA: Insufficient documentation

## 2014-05-14 LAB — RAPID URINE DRUG SCREEN, HOSP PERFORMED
AMPHETAMINES: NOT DETECTED
BARBITURATES: NOT DETECTED
BENZODIAZEPINES: POSITIVE — AB
Cocaine: POSITIVE — AB
OPIATES: NOT DETECTED
Tetrahydrocannabinol: NOT DETECTED

## 2014-05-14 LAB — PREGNANCY, URINE: Preg Test, Ur: NEGATIVE

## 2014-05-14 MED ORDER — MAGNESIUM HYDROXIDE 400 MG/5ML PO SUSP: 30.0000 mL | Freq: Every day | ORAL | Status: DC | PRN

## 2014-05-14 MED ORDER — FOLIC ACID 1 MG PO TABS
1.0000 mg | ORAL_TABLET | Freq: Every day | ORAL | Status: DC
  Administered 2014-05-14: 1 mg via ORAL
  Filled 2014-05-14 (×3): qty 1

## 2014-05-14 MED ORDER — LORAZEPAM 2 MG/ML IJ SOLN: 1.0000 mg | Freq: Four times a day (QID) | INTRAMUSCULAR | Status: DC | PRN

## 2014-05-14 MED ORDER — ADULT MULTIVITAMIN W/MINERALS CH
1.0000 | ORAL_TABLET | Freq: Every day | ORAL | Status: DC
  Administered 2014-05-14: 1 via ORAL
  Filled 2014-05-14 (×3): qty 1

## 2014-05-14 MED ORDER — VITAMIN B-1 100 MG PO TABS
100.0000 mg | ORAL_TABLET | Freq: Every day | ORAL | Status: DC
  Administered 2014-05-14: 100 mg via ORAL
  Filled 2014-05-14 (×3): qty 1

## 2014-05-14 MED ORDER — LORAZEPAM 1 MG PO TABS: 1.0000 mg | ORAL_TABLET | Freq: Four times a day (QID) | ORAL | Status: DC | PRN

## 2014-05-14 MED ORDER — THIAMINE HCL 100 MG/ML IJ SOLN: 100.0000 mg | Freq: Every day | INTRAMUSCULAR | Status: DC

## 2014-05-14 MED ORDER — ALUM & MAG HYDROXIDE-SIMETH 200-200-20 MG/5ML PO SUSP: 30.0000 mL | ORAL | Status: DC | PRN

## 2014-05-14 MED ORDER — TRAZODONE HCL 50 MG PO TABS: 50.0000 mg | ORAL_TABLET | Freq: Every evening | ORAL | Status: DC | PRN

## 2014-05-14 MED ORDER — ACETAMINOPHEN 325 MG PO TABS: 650.0000 mg | ORAL_TABLET | Freq: Four times a day (QID) | ORAL | Status: DC | PRN

## 2014-05-14 NOTE — ED Notes (Signed)
Notified TTS of updated lab results.
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
## 2014-05-14 NOTE — Progress Notes (Signed)
Patient ID: Joanna SalvageBrittany N Karim, female   DOB: [birth year], 28 y.o.   MRN: [id number] DIS-CHARGE  NOTE  ---  Discharge pt. As ordered.  All possessions were returned.  Bus passes were provided.  Pt. Agreed to go to homeless shelter for the next 2 nights and then [facility]   .   Pr. Agreed to join a local AA group for support.  She stated having no family to count on for help.  Pt. Agreed to stay safe after DC and stated no pain or dis-comfort at time of DC.  Pt. Made positive states as she was  being DC'd  --  A --  Escort pt. To front lobby at 1415 hrs., 05/14/14.   --- R  --  Pt. Was safe at time of DC

## 2014-05-14 NOTE — BH Assessment (Signed)
[facility].   Clista BernhardtNancy Jett Fukuda, [facility] Triage Specialist 05/14/2014 3:27 AM

## 2014-05-14 NOTE — ED Notes (Signed)
Lab reports urine sample leaked from container.

## 2014-05-14 NOTE — Progress Notes (Signed)
[facility] Observation Crisis Plan  Reason for Crisis Plan:  Substance Abuse   Plan of Care:  Referral for Substance Abuse  Family Support:      Current Living Environment:  Living Arrangements: Non-relatives/Friends  Insurance:   Hospital Account    Name Acct ID Class Status Primary Coverage   Joanna Deleon, Joanna Deleon [id number] BEHAVIORAL HEALTH OBSERVATION Open None        Guarantor Account (for Hospital Account [id number])    Name Relation to Pt Service Area Active? Acct Type   Joanna Deleon, Joanna Deleon Self [facility] Yes Behavioral Health   Address Phone       [address] [facility] [place] [address] [phone])          Coverage Information (for Hospital Account [id number])    Not on file      Legal Guardian:     Primary Care Provider:  No PCP Per Patient  Current Outpatient Providers:  None  Psychiatrist:     Counselor/Therapist:     Compliant with Medications:  No  Additional Information:   Karyl KinnierSmith, Balin Vandegrift T 1/11/20164:11 AM

## 2014-05-14 NOTE — Progress Notes (Signed)
[facility] INPATIENT:  Family/Significant Other Suicide Prevention Education  Suicide Prevention Education:  Patient Refusal for Family/Significant Other Suicide Prevention Education: The patient Joanna SalvageBrittany N Queener has refused to provide written consent for family/significant other to be provided Family/Significant Other Suicide Prevention Education during admission and/or prior to discharge.  Physician notified.  Leda QuailSmith, Cully Luckow T 05/14/2014, 4:09 AM

## 2015-01-13 ENCOUNTER — Encounter (HOSPITAL_COMMUNITY): Payer: Self-pay | Admitting: Emergency Medicine

## 2015-01-13 ENCOUNTER — Emergency Department (HOSPITAL_COMMUNITY)
Admission: EM | Admit: 2015-01-13 | Discharge: 2015-01-13 | Disposition: A | Discharge status: Home / Self Care | Payer: Medicaid Other | Attending: Emergency Medicine | Admitting: Emergency Medicine

## 2015-01-13 DIAGNOSIS — T679XXA Effect of heat and light, unspecified, initial encounter: Secondary | ICD-10-CM

## 2015-01-13 DIAGNOSIS — Y9289 Other specified places as the place of occurrence of the external cause: Secondary | ICD-10-CM | POA: Insufficient documentation

## 2015-01-13 DIAGNOSIS — R000 Tachycardia, unspecified: Secondary | ICD-10-CM | POA: Insufficient documentation

## 2015-01-13 DIAGNOSIS — T675XXA Heat exhaustion, unspecified, initial encounter: Secondary | ICD-10-CM | POA: Insufficient documentation

## 2015-01-13 DIAGNOSIS — Y9301 Activity, walking, marching and hiking: Secondary | ICD-10-CM | POA: Insufficient documentation

## 2015-01-13 DIAGNOSIS — Y998 Other external cause status: Secondary | ICD-10-CM | POA: Insufficient documentation

## 2015-01-13 DIAGNOSIS — X30XXXA Exposure to excessive natural heat, initial encounter: Secondary | ICD-10-CM | POA: Insufficient documentation

## 2015-01-13 DIAGNOSIS — Z8659 Personal history of other mental and behavioral disorders: Secondary | ICD-10-CM | POA: Insufficient documentation

## 2015-01-13 DIAGNOSIS — Z720 Tobacco use: Secondary | ICD-10-CM | POA: Insufficient documentation

## 2015-01-13 HISTORY — DX: Other psychoactive substance abuse, uncomplicated: F19.10

## 2015-01-13 LAB — CBC WITH DIFFERENTIAL/PLATELET
Basophils Absolute: 0 10*3/uL (ref 0.0–0.1)
Basophils Relative: 0 % (ref 0–1)
Eosinophils Absolute: 0.2 10*3/uL (ref 0.0–0.7)
Eosinophils Relative: 3 % (ref 0–5)
HCT: 43.1 % (ref 36.0–46.0)
HEMOGLOBIN: 14.4 g/dL (ref 12.0–15.0)
LYMPHS ABS: 2.6 10*3/uL (ref 0.7–4.0)
Lymphocytes Relative: 33 % (ref 12–46)
MCH: 30.8 pg (ref 26.0–34.0)
MCHC: 33.4 g/dL (ref 30.0–36.0)
MCV: 92.3 fL (ref 78.0–100.0)
Monocytes Absolute: 0.8 10*3/uL (ref 0.1–1.0)
Monocytes Relative: 10 % (ref 3–12)
NEUTROS PCT: 54 % (ref 43–77)
Neutro Abs: 4.2 10*3/uL (ref 1.7–7.7)
Platelets: 189 10*3/uL (ref 150–400)
RBC: 4.67 MIL/uL (ref 3.87–5.11)
RDW: 13.5 % (ref 11.5–15.5)
WBC: 7.9 10*3/uL (ref 4.0–10.5)

## 2015-01-13 LAB — BASIC METABOLIC PANEL
Anion gap: 7 (ref 5–15)
BUN: 8 mg/dL (ref 6–20)
CHLORIDE: 105 mmol/L (ref 101–111)
CO2: 27 mmol/L (ref 22–32)
Calcium: 9 mg/dL (ref 8.9–10.3)
Creatinine, Ser: 0.74 mg/dL (ref 0.44–1.00)
GFR calc Af Amer: >60 mL/min (ref >60)
GFR calc non Af Amer: >60 mL/min (ref >60)
Glucose, Bld: 71 mg/dL (ref 65–99)
POTASSIUM: 4.1 mmol/L (ref 3.5–5.1)
SODIUM: 139 mmol/L (ref 135–145)

## 2015-01-13 LAB — I-STAT TROPONIN, ED: TROPONIN I, POC: 0 ng/mL (ref 0.00–0.08)

## 2015-01-13 MED ORDER — ONDANSETRON HCL 4 MG/2ML IJ SOLN
4.0000 mg | Freq: Once | INTRAMUSCULAR | Status: AC
  Administered 2015-01-13: 4 mg via INTRAVENOUS
  Filled 2015-01-13: qty 2

## 2015-01-13 MED ORDER — SODIUM CHLORIDE 0.9 % IV BOLUS (SEPSIS)
1000.0000 mL | Freq: Once | INTRAVENOUS | Status: AC
  Administered 2015-01-13: 1000 mL via INTRAVENOUS
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
## 2015-01-13 NOTE — Discharge Instructions (Signed)
Read the information below.  You may return to the Emergency Department at any time for worsening condition or any new symptoms that concern you.   Heat Illness If the body is unable maintain a proper body temperature in hot and/or humid conditions during physical activity, severe illness may occur. To maintain a relatively constant body temperature, the body radiates heat out to the environment and evaporates sweat. In very hot and/or humid conditions, these two methods of heat loss may not work properly. In order to perform physically in such a climate, the body must have time to acclimate (make physiological changes to compensate), such as increase the rate of sweating. When performing physical activity in hot and/or humid environments, it is important to stay hydrated. Adequate hydration will help the body sweat properly. If the body temperature is allowed to increase too much, a person's judgment and performance will decline. SYMPTOMS   Dizziness.  Fatigue.  Changes in judgment.  Muscle cramps.  Weakness.  Nausea and vomiting.  Rapid heart rate.  Fainting.  Death.  Diarrhea.  Seizures.  Liver failure.  Kidney failure.  Low blood pressure.  Loss of consciousness (coma).  Elevated body temperature. CAUSES   Hot and/or humid conditions.  Poor conditioning.  Not being acclimated to the heat.  Dehydration.  Obesity.  Inappropriate clothing (does not allow water to evaporate).  Age (very old and very young people).  Medications: diuretics, caffeine, decongestants, stimulants, some blood pressure medications. RISK INCREASES WITH:  Older age (decreased body water, decreased blood supply to skin, resulting in decreased sweating, decreased sweat rate).  Young boys (decreased sweat rate compared with men).  Dehydration.  Not being acclimated to the heat (this takes 1 to 2 hours per day for a minimum of 6 days).  Waiting until thirsty to drink.  Use of  stimulants. Amphetamines, cocaine, or decongestants increase risk for heat illness.  Use of diuretics (increases urination).  Use of medicines with anticholinergic properties.  Use of medicines that slow the heart rate. PREVENTION   Maintain needed hydration before, during, and after exercise.  Wear clothing that allows sweat to evaporate (light colored, lightweight, breathable).  Take the time to acclimate to the heat.  Avoid salt tablets (they irritate the stomach).  Monitor weight after practices.  Avoid physical activity during the hottest times of day. PROGNOSIS  Most athletes who suffer from heat illness will recover completely, if treated. Severe heat illness is a medical emergency and may require hospitalization. RELATED COMPLICATIONS   Rhabdomyolysis (death of muscle, resulting in weakness and pain).  Acute respiratory distress syndrome (lining of the lung is altered to prevent oxygen from getting into the bloodstream, which can result in death).  Disseminated intravascular coagulation (spontaneous clotting of the blood, resulting in an inability to make normal blood clots when needed).  Kidney failure.  Liver failure.  Seizures (abnormal electrical activity in the brain).  Death. TREATMENT The most important treatment is to remove affected persons from the heat. Give the patient cool water to drink. For severe cases of heat illness, it may be necessary to cool the patient more aggressively, such as with an ice bath or cold shower. If symptoms persist after treatment, seek medical attention. MEDICATION   Oxygen is used in severe cases, if there is lung damage.  Fluid injections may be given for hydration. ACTIVITY   A patient may return to sport as soon as he or she is able.  Heat illness may make an athlete vulnerable to  future episodes of heat illness.  Allow the body to acclimate before performing in hot and/or humid conditions. DIET  Drink 8 oz. of  fluid before exercise and 4 oz. of fluid every 15 to 20 minutes during exercise. As an alternative, try to drink about 1 quart of fluid for every hour of exercise. SEEK MEDICAL CARE IF:   You develop vomiting or diarrhea after exercising in the heat.  Someone collapses while exercising in the heat.  You have increasing problems exercising in the heat.  You notice increased muscle aches after exercising in the heat.  There is a change in the color of your urine after exercise. Document Released: 04/20/2005 Document Revised: 07/13/2011 Document Reviewed: 08/02/2008 [facility] Patient Information 2015 [place], [place]. This information is not intended to replace advice given to you by your health care provider. Make sure you discuss any questions you have with your health care provider.  Behavioral Health Resources in the [facility]  Intensive Outpatient Programs: [facility]      [address] [place], [place] [phone] Both a day and evening program       [facility]     [address]        [place], [place] [address] [phone]         ADS: [facility] [address] [place] [phone]  [facility] Mental Health ACCESS LINE: [phone] or [phone] [address] [place], [place] [address] [email or web address]  Mobile Crisis Teams:                                        [facility]         Mobile Crisis Care Unit [phone]             [facility] [address] Ginette Otto [phone]                                         [facility] [address] [place] [place] [phone]  Self-Help/Support Groups: [facility] [employer] of support groups [phone] (call for more info)  Narcotics Anonymous (NA) [facility] [address] [place] - 2  meetings at this location  Residential Treatment Programs:  [facility]      [address]        [place]       [phone]         [facility] [address] [place] [address] [place], [place]  [address] [phone]  [facility]  [address] [place], [place] [address] [phone] Admissions: 8am-3pm M-F  [facility]     [address]        [place], [place] [address]       [phone]         [facility] [address] [place], [place] [phone]  [facility] [address] [place], [place] [phone]  [facility] - Intensive Outpatient      [address]     [place], [place]       [phone]         [facility]  [address] [place], [place] [phone] or [phone]  [facility] ([facility])  [address] [place], [place] [phone]  [facility] [address]                                               [address] [place] [phone]  [facility] Resources: [phone]               General Therapy                                                Angie Fava, PhD        [address] [place], [place] [address]         [phone]   Insurance  [facility]   [address] [place], [place] [address] [phone]  [facility] [address] [place], [place] [address] [phone] Insurance/Medicaid/sponsorship through [facility] and Families                                              [address]                                        [place], [place] [address]    Therapy/tele-psych/case         [phone]          [facility] [address], [place]  [address]  Adolescent/group home/case management [phone]                                               Creola Corn PhD       General therapy       Insurance   [phone]         Dr. Lolly Mustache Insurance [phone] M-F  [facility]/Residential Medicaid, sponsorship [phone]

## 2015-01-13 NOTE — ED Notes (Signed)
Pt refused to wait for discharge papers as RN's request (I had to address an issue with another patient and told her I would be right back.  She allowed tech to remove iv prior to discharge.  Patient left AMA

## 2015-01-13 NOTE — ED Notes (Signed)
Pt given ginger ale and saltine crackers and encouraged to eat and drink.

## 2015-05-27 ENCOUNTER — Encounter (HOSPITAL_COMMUNITY): Payer: Self-pay | Admitting: *Deleted

## 2015-05-27 ENCOUNTER — Inpatient Hospital Stay (HOSPITAL_COMMUNITY)
Admission: AD | Admit: 2015-05-27 | Discharge: 2015-05-27 | Payer: Medicaid Other | Source: Ambulatory Visit | Attending: Family Medicine | Admitting: Family Medicine

## 2015-05-27 DIAGNOSIS — Z3201 Encounter for pregnancy test, result positive: Secondary | ICD-10-CM | POA: Diagnosis not present

## 2015-05-27 DIAGNOSIS — O23591 Infection of other part of genital tract in pregnancy, first trimester: Secondary | ICD-10-CM | POA: Diagnosis not present

## 2015-05-27 DIAGNOSIS — A499 Bacterial infection, unspecified: Secondary | ICD-10-CM

## 2015-05-27 DIAGNOSIS — F1010 Alcohol abuse, uncomplicated: Secondary | ICD-10-CM | POA: Insufficient documentation

## 2015-05-27 DIAGNOSIS — F17210 Nicotine dependence, cigarettes, uncomplicated: Secondary | ICD-10-CM | POA: Insufficient documentation

## 2015-05-27 DIAGNOSIS — S0091XA Abrasion of unspecified part of head, initial encounter: Secondary | ICD-10-CM | POA: Insufficient documentation

## 2015-05-27 DIAGNOSIS — Z3A00 Weeks of gestation of pregnancy not specified: Secondary | ICD-10-CM | POA: Insufficient documentation

## 2015-05-27 DIAGNOSIS — O99331 Smoking (tobacco) complicating pregnancy, first trimester: Secondary | ICD-10-CM | POA: Insufficient documentation

## 2015-05-27 DIAGNOSIS — N760 Acute vaginitis: Secondary | ICD-10-CM | POA: Diagnosis not present

## 2015-05-27 DIAGNOSIS — B9689 Other specified bacterial agents as the cause of diseases classified elsewhere: Secondary | ICD-10-CM

## 2015-05-27 LAB — URINALYSIS, ROUTINE W REFLEX MICROSCOPIC
BILIRUBIN URINE: NEGATIVE
Glucose, UA: NEGATIVE mg/dL
Hgb urine dipstick: NEGATIVE
Ketones, ur: NEGATIVE mg/dL
Leukocytes, UA: NEGATIVE
Nitrite: NEGATIVE
PH: 5.5 (ref 5.0–8.0)
Protein, ur: NEGATIVE mg/dL
SPECIFIC GRAVITY, URINE: 1.01 (ref 1.005–1.030)

## 2015-05-27 LAB — RAPID URINE DRUG SCREEN, HOSP PERFORMED
Amphetamines: NOT DETECTED
Barbiturates: NOT DETECTED
Benzodiazepines: NOT DETECTED
Cocaine: POSITIVE — AB
Opiates: NOT DETECTED
Tetrahydrocannabinol: POSITIVE — AB

## 2015-05-27 LAB — POCT PREGNANCY, URINE: Preg Test, Ur: POSITIVE — AB

## 2015-05-27 LAB — WET PREP, GENITAL
SPERM: NONE SEEN
Trich, Wet Prep: NONE SEEN
YEAST WET PREP: NONE SEEN

## 2015-05-27 MED ORDER — METRONIDAZOLE 500 MG PO TABS: 500.0000 mg | ORAL_TABLET | Freq: Two times a day (BID) | ORAL | Status: DC

## 2015-05-27 NOTE — MAU Note (Signed)
Pregnancy verification letter copy given to pt.   Shelter info with addresses and numbers given to pt.  Pt instructed to pick up flagyl at pharmacy after released from [facility].  Expected to possibly be released tomorrow.  Pt verbalized understanding.
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
## 2015-05-27 NOTE — Discharge Instructions (Signed)
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
## 2015-05-28 LAB — GC/CHLAMYDIA PROBE AMP (~~LOC~~) NOT AT ARMC
Chlamydia: NEGATIVE
NEISSERIA GONORRHEA: NEGATIVE

## 2015-12-05 ENCOUNTER — Encounter (HOSPITAL_COMMUNITY): Payer: Self-pay | Admitting: *Deleted

## 2015-12-05 ENCOUNTER — Inpatient Hospital Stay (HOSPITAL_COMMUNITY)
Admission: AD | Admit: 2015-12-05 | Discharge: 2015-12-07 | DRG: 776 | Disposition: A | Discharge status: Home / Self Care | Payer: Medicaid Other | Source: Ambulatory Visit | Attending: Family Medicine | Admitting: Family Medicine

## 2015-12-05 DIAGNOSIS — F17210 Nicotine dependence, cigarettes, uncomplicated: Secondary | ICD-10-CM | POA: Diagnosis present

## 2015-12-05 DIAGNOSIS — O99335 Smoking (tobacco) complicating the puerperium: Secondary | ICD-10-CM | POA: Diagnosis present

## 2015-12-05 DIAGNOSIS — F319 Bipolar disorder, unspecified: Secondary | ICD-10-CM

## 2015-12-05 DIAGNOSIS — F1410 Cocaine abuse, uncomplicated: Secondary | ICD-10-CM | POA: Diagnosis present

## 2015-12-05 DIAGNOSIS — O99345 Other mental disorders complicating the puerperium: Secondary | ICD-10-CM | POA: Diagnosis present

## 2015-12-05 DIAGNOSIS — O99325 Drug use complicating the puerperium: Principal | ICD-10-CM | POA: Diagnosis present

## 2015-12-05 LAB — RAPID URINE DRUG SCREEN, HOSP PERFORMED
AMPHETAMINES: NOT DETECTED
BARBITURATES: NOT DETECTED
Benzodiazepines: NOT DETECTED
Cocaine: POSITIVE — AB
OPIATES: NOT DETECTED
TETRAHYDROCANNABINOL: NOT DETECTED

## 2015-12-05 LAB — CBC
HEMATOCRIT: 30.6 % — AB (ref 36.0–46.0)
HEMOGLOBIN: 10.6 g/dL — AB (ref 12.0–15.0)
MCH: 30.3 pg (ref 26.0–34.0)
MCHC: 34.6 g/dL (ref 30.0–36.0)
MCV: 87.4 fL (ref 78.0–100.0)
Platelets: 222 10*3/uL (ref 150–400)
RBC: 3.5 MIL/uL — AB (ref 3.87–5.11)
RDW: 13.4 % (ref 11.5–15.5)
WBC: 22.8 10*3/uL — ABNORMAL HIGH (ref 4.0–10.5)

## 2015-12-05 LAB — MRSA PCR SCREENING: MRSA BY PCR: NEGATIVE

## 2015-12-05 MED ORDER — SENNOSIDES-DOCUSATE SODIUM 8.6-50 MG PO TABS
2.0000 | ORAL_TABLET | ORAL | Status: DC
  Administered 2015-12-05 – 2015-12-06 (×2): 2 via ORAL
  Filled 2015-12-05 (×2): qty 2

## 2015-12-05 MED ORDER — ZOLPIDEM TARTRATE 5 MG PO TABS
5.0000 mg | ORAL_TABLET | Freq: Every evening | ORAL | Status: DC | PRN
Start: 2015-12-05 — End: 2015-12-07

## 2015-12-05 MED ORDER — IBUPROFEN 600 MG PO TABS
600.0000 mg | ORAL_TABLET | Freq: Four times a day (QID) | ORAL | Status: DC
  Administered 2015-12-05 – 2015-12-07 (×7): 600 mg via ORAL
  Filled 2015-12-05 (×7): qty 1

## 2015-12-05 MED ORDER — PRENATAL MULTIVITAMIN CH
1.0000 | ORAL_TABLET | Freq: Every day | ORAL | Status: DC
  Administered 2015-12-06: 1 via ORAL
  Filled 2015-12-05: qty 1

## 2015-12-05 MED ORDER — ACETAMINOPHEN 325 MG PO TABS: 650.0000 mg | ORAL_TABLET | ORAL | Status: DC | PRN

## 2015-12-05 MED ORDER — COCONUT OIL OIL: 1.0000 "application " | TOPICAL_OIL | Status: DC | PRN

## 2015-12-05 MED ORDER — BENZOCAINE-MENTHOL 20-0.5 % EX AERO: 1.0000 "application " | INHALATION_SPRAY | CUTANEOUS | Status: DC | PRN

## 2015-12-05 MED ORDER — ONDANSETRON HCL 4 MG/2ML IJ SOLN: 4.0000 mg | INTRAMUSCULAR | Status: DC | PRN

## 2015-12-05 MED ORDER — ONDANSETRON HCL 4 MG PO TABS: 4.0000 mg | ORAL_TABLET | ORAL | Status: DC | PRN

## 2015-12-05 MED ORDER — SIMETHICONE 80 MG PO CHEW: 80.0000 mg | CHEWABLE_TABLET | ORAL | Status: DC | PRN

## 2015-12-05 MED ORDER — WITCH HAZEL-GLYCERIN EX PADS: 1.0000 "application " | MEDICATED_PAD | CUTANEOUS | Status: DC | PRN

## 2015-12-05 MED ORDER — DIBUCAINE 1 % RE OINT: 1.0000 "application " | TOPICAL_OINTMENT | RECTAL | Status: DC | PRN

## 2015-12-05 MED ORDER — OXYCODONE-ACETAMINOPHEN 5-325 MG PO TABS
1.0000 | ORAL_TABLET | Freq: Once | ORAL | Status: AC
  Administered 2015-12-05: 1 via ORAL
  Filled 2015-12-05: qty 1

## 2015-12-05 MED ORDER — DIPHENHYDRAMINE HCL 25 MG PO CAPS: 25.0000 mg | ORAL_CAPSULE | Freq: Four times a day (QID) | ORAL | Status: DC | PRN

## 2015-12-05 MED ORDER — TETANUS-DIPHTH-ACELL PERTUSSIS 5-2.5-18.5 LF-MCG/0.5 IM SUSP: 0.5000 mL | Freq: Once | INTRAMUSCULAR | Status: DC

## 2015-12-05 NOTE — H&P (Signed)
LABOR AND DELIVERY ADMISSION HISTORY AND PHYSICAL NOTE  Joanna Deleon is a 30 y.o. female G1P0 with IUP at [redacted]w[redacted]d by LMP confirmed with anatomy scan presenting for pre-term labor and delivery at home. Child and placenta delivered spontaneously.    She reports she was receiving her prenatal care in [place] due to the nature of her high-risk pregnancy. She was high risk due to crack and cocaine abuse. She reports that she last used yesterday. She reports she was told she had no other issues with the pregnancy aside from the drug use. She reports she felt constipated over the past several days and she now thinks these were contractions. She reports that he started to feel extreme pressure and 10 minutes later she had a baby. Placenta delivered about 3 minutes after that per her report.  Prenatal History/Complications:  Past Medical History: Past Medical History:  Diagnosis Date  . Alcohol abuse   . Bipolar 1 disorder (HCC)   . Depression   . Drug abuse     Past Surgical History: Past Surgical History:  Procedure Laterality Date  . detox     ETOH    Obstetrical History: OB History    Gravida Para Term Preterm AB Living   1             SAB TAB Ectopic Multiple Live Births                  Social History: Social History   Social History  . Marital status: Single    Spouse name: N/A  . Number of children: N/A  . Years of education: N/A   Social History Main Topics  . Smoking status: Current Every Day Smoker    Packs/day: 1.00    Types: Cigarettes  . Smokeless tobacco: Never Used  . Alcohol use Yes     Comment: rarely  . Drug use:     Types: Marijuana  . Sexual activity: Yes    Birth control/ protection: None   Other Topics Concern  . Not on file   Social History Narrative   ** Merged History Encounter **        Family History: No family history on file.  Allergies: No Known Allergies  Prescriptions Prior to Admission  Medication Sig Dispense Refill  Last Dose  . metroNIDAZOLE (FLAGYL) 500 MG tablet Take 1 tablet (500 mg total) by mouth 2 (two) times daily. 14 tablet 0      Review of Systems   All systems reviewed and negative except as stated in HPI  Last menstrual period 04/18/2015. General appearance: alert, cooperative and appears stated age Lungs: clear to auscultation bilaterally Heart: regular rate and rhythm Abdomen: soft, non-tender; bowel sounds normal Fundus: Firm approximately 2 fingers below umbilicus Vaginal area: small left labial tear noted. Extremities: No calf swelling or tenderness      Prenatal labs: Unknown will request records  Prenatal Transfer Tool  Maternal Diabetes: No Genetic Screening: Declined Maternal Ultrasounds/Referrals: Normal per patient report Fetal Ultrasounds or other Referrals:  None Maternal Substance Abuse:  Cocaine Significant Maternal Medications:  None Significant Maternal Lab Results: None  No results found for this or any previous visit (from the past 24 hour(s)).  Patient Active Problem List   Diagnosis Date Noted  . Bipolar 1 disorder (HCC) 05/14/2014  . Alcohol abuse with intoxication (HCC)   . Depressive disorder 11/05/2013  . Alcohol intoxication (HCC) 11/05/2013  . Substance induced mood disorder (HCC) 11/05/2013  .  Alcohol dependence (HCC) 02/27/2013  . Major depression (HCC) 02/27/2013  . Alcohol withdrawal (HCC) 02/26/2013  . Bipolar disorder, unspecified (HCC) 02/26/2013  . Polysubstance abuse 02/26/2013    Assessment: Joanna Deleon is a 30 y.o. G1P0 at [redacted]w[redacted]d here for Spontaneous delivery of a child and placenta at home  Child transferred to NICU on admission to the MAU. Mother stable. Bleeding minimal. Routine postpartum care. Type and screen, CBC, RPR ordered UDS ordered  We'll give oxycodone 1 for pain.  Ernestina Penna 12/05/2015, 3:32 PM

## 2015-12-05 NOTE — Progress Notes (Signed)
Pt arrived by EMS.  Delivered at  Home, unattended delivery.  Live female infant at 61.  Reported [facility] 9/21. NICU here on arrival. Pt states was getting care in [facility] due to 'high risk'.  High Risk due to cocaine and crack usage.  States last used was yesterday. (NICU informed).  Pt states started having cramping on Friday- thought she was constipated. Pain got worse few hrs before delivery.

## 2015-12-05 NOTE — Progress Notes (Signed)
Pt  States  She had MRSA when she was !*YRS  Old   Will have  Night nurse  Do PCR test

## 2015-12-05 NOTE — MAU Note (Signed)
States that she was getting her prenatal care in [place] due to maternal cocaine use. Delivered at home and brought in by EMS. Delivered vaginally; female infant @ 1430; has small L labial tear;

## 2015-12-05 NOTE — MAU Note (Signed)
Used cocaine yesterday;

## 2015-12-06 LAB — RAPID HIV SCREEN (HIV 1/2 AB+AG)
HIV 1/2 ANTIBODIES: NONREACTIVE
HIV-1 P24 Antigen - HIV24: NONREACTIVE

## 2015-12-06 LAB — RPR: RPR Ser Ql: NONREACTIVE

## 2015-12-06 LAB — HEPATITIS B SURFACE ANTIGEN: Hepatitis B Surface Ag: NEGATIVE

## 2015-12-06 NOTE — Progress Notes (Signed)
Patient ID: Joanna Deleon, female   DOB: 03/16/1986, 30 y.o.   MRN: [id number]  POSTPARTUM PROGRESS NOTE  Post Partum Day #1 Subjective:  Joanna Deleon is a 30 y.o. G1P0 [redacted]w[redacted]d s/p SVD at home.  No acute events overnight.  Pt denies problems with ambulating, voiding or po intake.  She denies nausea or vomiting.  Pain is well controlled.  She has had flatus. She has had bowel movement.  Lochia Minimal.   Objective: Blood pressure (!) 94/46, pulse 68, temperature 98.5 F (36.9 C), temperature source Oral, resp. rate 17, last menstrual period 04/18/2015, SpO2 100 %.  Physical Exam:  General: alert, cooperative and no distress Lochia:normal flow Chest: CTAB Heart: RRR no m/r/g Abdomen: +BS, soft, nontender,  Uterine Fundus: firm, below umbilicus DVT Evaluation: No calf swelling or tenderness Extremities: No edema   Recent Labs  12/05/15 1615  HGB 10.6*  HCT 30.6*    Assessment/Plan:  ASSESSMENT: Joanna Deleon is a 30 y.o. G1P0 [redacted]w[redacted]d s/p SVD at home.  Plan for discharge tomorrow and Social Work consulted and working on the case. UDS +cocaine, patient admitted to using "crack cocaine" the day before delivery, social work aware.   LOS: 1 day   Jen Mow, DO OB Fellow 12/06/2015, 10:49 AM
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
## 2015-12-07 LAB — RUBELLA SCREEN

## 2015-12-07 MED ORDER — PNEUMOCOCCAL VAC POLYVALENT 25 MCG/0.5ML IJ INJ
0.5000 mL | INJECTION | INTRAMUSCULAR | Status: DC
  Filled 2015-12-07: qty 0.5

## 2015-12-07 NOTE — Clinical Social Work Maternal (Signed)
CLINICAL SOCIAL WORK MATERNAL/CHILD NOTE  Patient Details  Name: Joanna Deleon MRN: [id number] Date of Birth: 1986-01-28  Date:  12/07/2015  Clinical Social Worker Initiating Note:   Rigoberto Noel, LCSW Date/ Time Initiated:   12/07/15/ 1000     Child's Name:   Cyndia Bent   Legal Guardian:   Oneita Kras    Need for Interpreter:    No  Date of Referral:   12/06/15     Reason for Referral:    Hx of substance use during pregnancy  Referral Source:    MAU  Address:    [address] [address]  Phone number:    [phone], [phone], [phone]  Household Members:   MOB,Mykaila, FOB, Larkin Ina and Justin's uncle  Natural Supports (not living in the home):    Friends and family  Professional Supports:   None  Employment:   MOB not employed  Type of Work:   NA  Education:    Chartered certified accountant:    Support from [employer]  Other Resources:    [employer], [employer], Medicaid  Cultural/Religious Considerations Which May Impact Care:  None reported  Strengths:   Received prenatal care, Supportive family and friends, most basic need for child. MOB reported that they will be given the rest of the items needed by family and friends.  Risk Factors/Current Problems:   Substance use hx with MOB and FOB. Limited income, FOB reported he would like employment resources. No pediatrician arranged for baby. MOB requested help with more stable housing. Limited transportation. Want assistance with transportation to see baby at hospital, possibly Medicaid transport and for future appointments.  Cognitive State:    Appropriate  Mood/Affect:    Good  CSW Assessment: CSW met with MOB to complete assessment. MOB acknowledged that assessment could be completed with FOB present. MOB was informed of reason for referral due to substance and hospital's policy. MOB confirmed understanding. MOB reported that she had been using mostly cocaine during pregnancy and that last time  of use was either the Aug 1 or Aug 2. MOB reported that she had not been using in a while prior to this occurrence. MOB reported she uses about $20 worth about 3-4 times a month. MOB stated she also has drank some beer during pregnancy but reported it was a beer or two and it was occasional use. MOB reported she had participated in rehab at [facility] a few years ago but stated "I didn't take it seriously but I have to now." MOB and FOB present anxious about information about when they will be able to take the baby home.  CSW informed that DSS will follow up with MOB about recommendations moving forward. MOB and FOB were cooperative and understanding about the process needed due to substance use. MOB requesting housing resources as they are currently living with FOB's uncle. MOB also requested assistance with transportation to and from hospital to see baby. Possibly Medicaid transportation. FOB also requesting resources for employment.  CSW Plan/Description:   CSW provided MOB and FOB resources for substance abuse counseling. MOB and FOB agree to follow up with referral for outpatient substance abuse counseling ([facility]). CSW recommended MOB to contact Medicaid to assist with transportation. CSW also provided MOB 2 bus passes to assist with transportation as well. CSW informed MOB and FOB that [place] worker will follow asap with recommendations in regards to custody.    Randie Tallarico R, LCSW  12/07/2015, 10:31 AM

## 2015-12-07 NOTE — Progress Notes (Signed)
CSW attempted to meet with MOB.  When CSW went to MOB's room, CSW was informed that MOB reported MOB was going to the NICU.  CSW went to NICU to attempt to meet with MOB.  However, MOB was not there.  Bedside NICU nurse informed CSW that MOB has not visited this morning.      CSW will attempt visit MOB at a later time.   Skylen Danielsen Boyd-Gilyard, MSW, LCSW Clinical Social Work [phone] 

## 2015-12-07 NOTE — Discharge Instructions (Signed)

## 2015-12-07 NOTE — Discharge Summary (Signed)
OB Discharge Summary     Patient Name: Joanna Deleon DOB: [birth year] MRN: [id number]  Date of admission: 12/05/2015 Delivering MD:    None - Delivered spontaneously including placenta at home  Date of discharge: 12/07/2015  Admitting diagnosis: 33w delivered at home Intrauterine pregnancy: [redacted]w[redacted]d     Secondary diagnosis:  Active Problems:   Preterm delivery   Current Drug Use  Additional problems: None     Discharge diagnosis: Preterm Pregnancy Delivered                                                                                                Post partum procedures:none  Augmentation: none  Complications: None  Hospital course:  Onset of Labor With Vaginal Delivery     30 y.o. yo G1P0 at [redacted]w[redacted]d was admitted in status post spontaneous home delivery including placenta on 12/05/2015. Patient had an uncomplicated labor course as follows:  Membrane Rupture Time/Date: 12:00 PM ,12/05/2015  and delivery at home. Patient had a delivery of a Viable infant. 12/05/2015  Information for the patient's newborn:  Saron, Keightley [[id number]]  Delivery Method: Vaginal, Spontaneous Delivery (Filed from Delivery Summary)    Pateint had an uncomplicated postpartum course.  She is ambulating, tolerating a regular diet, passing flatus, and urinating well. Patient is discharged home in stable condition on 12/07/15.    Physical exam Vitals:   12/06/15 1222 12/06/15 1733 12/06/15 2124 12/07/15 0545  BP: (!) 103/52 (!) 109/54 111/62 (!) 96/59  Pulse: 69 61 77 67  Resp: 18 17 20 18   Temp: 98.4 F (36.9 C) 98.9 F (37.2 C) 98.3 F (36.8 C) 98.4 F (36.9 C)  TempSrc: Oral Oral Oral Oral  SpO2: 100%  99% 100%   General: alert, cooperative and no distress Lochia: appropriate Uterine Fundus: firm Incision: N/A DVT Evaluation: No evidence of DVT seen on physical exam. No significant calf/ankle edema. Labs: Lab Results  Component Value Date   WBC 22.8 (H) 12/05/2015   HGB 10.6 (L)  12/05/2015   HCT 30.6 (L) 12/05/2015   MCV 87.4 12/05/2015   PLT 222 12/05/2015   CMP Latest Ref Rng & Units 01/13/2015  Glucose 65 - 99 mg/dL 71  BUN 6 - 20 mg/dL 8  Creatinine 4.43 - 1.54 mg/dL 0.08  Sodium 676 - 195 mmol/L 139  Potassium 3.5 - 5.1 mmol/L 4.1  Chloride 101 - 111 mmol/L 105  CO2 22 - 32 mmol/L 27  Calcium 8.9 - 10.3 mg/dL 9.0  Total Protein 6.0 - 8.3 g/dL -  Total Bilirubin 0.3 - 1.2 mg/dL -  Alkaline Phos 39 - 093 U/L -  AST 0 - 37 U/L -  ALT 0 - 35 U/L -    Discharge instruction: per After Visit Summary and "Baby and Me Booklet".  After visit meds:    Medication List    STOP taking these medications   polyethylene glycol packet Commonly known as:  MIRALAX / GLYCOLAX     TAKE these medications   ibuprofen 200 MG tablet Commonly known as:  ADVIL,MOTRIN Take 400 mg by mouth every  6 (six) hours as needed.   prenatal multivitamin Tabs tablet Take 1 tablet by mouth daily at 12 noon.       Diet: routine diet  Activity: Advance as tolerated. Pelvic rest for 6 weeks.   Outpatient follow up:6 weeks Follow up Appt: In 6wks for postpartum visit with her OB in [place].   Postpartum contraception: undecided  Newborn Data: Live born female  Birth Weight: 3 lb 4.6 oz (1490 g)  Baby Feeding: Bottle Disposition:NICU   12/07/2015 Ernestina Penna, MD

## 2015-12-07 NOTE — Progress Notes (Signed)

## 2015-12-08 LAB — TYPE AND SCREEN
ABO/RH(D): O POS
ANTIBODY SCREEN: POSITIVE
DAT, IGG: NEGATIVE
PT AG Type: NEGATIVE
Unit division: 0
Unit division: 0

## 2015-12-08 NOTE — Progress Notes (Signed)
Linna CapriceJody M Dauna Ziska, LCSW Social Worker Signed Clinical Social Work  Progress Notes Date of Service: 12/08/2015 11:41 AM      [] Hide copied text [] Hover for attribution information CPS Worker, Amy Sherlon HandingRodriguez, will be meeting with pt's mother, Laurene FootmanBrittany Mapel this afternoon. CSW will continue to follow baby while in NICU, and provide CPS updates as necessary.  Pollyann SavoyJody Elayjah Chaney, LCSW Weekend Coverage [phone]

## 2015-12-12 ENCOUNTER — Encounter (HOSPITAL_COMMUNITY): Payer: Self-pay | Admitting: *Deleted

## 2015-12-18 ENCOUNTER — Encounter: Payer: Self-pay | Admitting: *Deleted

## 2016-01-16 ENCOUNTER — Ambulatory Visit: Payer: Self-pay | Admitting: Advanced Practice Midwife

## 2016-08-23 ENCOUNTER — Emergency Department (HOSPITAL_COMMUNITY): Payer: Medicaid Other

## 2016-08-23 ENCOUNTER — Encounter (HOSPITAL_COMMUNITY): Payer: Self-pay

## 2016-08-23 ENCOUNTER — Emergency Department (HOSPITAL_COMMUNITY)
Admission: EM | Admit: 2016-08-23 | Discharge: 2016-08-24 | Disposition: A | Discharge status: Home / Self Care | Payer: Medicaid Other | Attending: Emergency Medicine | Admitting: Emergency Medicine

## 2016-08-23 DIAGNOSIS — S62325A Displaced fracture of shaft of fourth metacarpal bone, left hand, initial encounter for closed fracture: Secondary | ICD-10-CM

## 2016-08-23 DIAGNOSIS — Y929 Unspecified place or not applicable: Secondary | ICD-10-CM | POA: Insufficient documentation

## 2016-08-23 DIAGNOSIS — Y999 Unspecified external cause status: Secondary | ICD-10-CM | POA: Insufficient documentation

## 2016-08-23 DIAGNOSIS — W1830XA Fall on same level, unspecified, initial encounter: Secondary | ICD-10-CM | POA: Insufficient documentation

## 2016-08-23 DIAGNOSIS — F17210 Nicotine dependence, cigarettes, uncomplicated: Secondary | ICD-10-CM | POA: Insufficient documentation

## 2016-08-23 DIAGNOSIS — R1030 Lower abdominal pain, unspecified: Secondary | ICD-10-CM | POA: Insufficient documentation

## 2016-08-23 DIAGNOSIS — N760 Acute vaginitis: Secondary | ICD-10-CM

## 2016-08-23 DIAGNOSIS — Y939 Activity, unspecified: Secondary | ICD-10-CM | POA: Insufficient documentation

## 2016-08-23 DIAGNOSIS — B9689 Other specified bacterial agents as the cause of diseases classified elsewhere: Secondary | ICD-10-CM

## 2016-08-23 LAB — URINALYSIS, ROUTINE W REFLEX MICROSCOPIC
BILIRUBIN URINE: NEGATIVE
Glucose, UA: NEGATIVE mg/dL
HGB URINE DIPSTICK: NEGATIVE
KETONES UR: NEGATIVE mg/dL
Leukocytes, UA: NEGATIVE
NITRITE: NEGATIVE
Protein, ur: NEGATIVE mg/dL
SPECIFIC GRAVITY, URINE: 1.01 (ref 1.005–1.030)
pH: 7 (ref 5.0–8.0)

## 2016-08-23 LAB — CBC
HCT: 40.9 % (ref 36.0–46.0)
Hemoglobin: 13.4 g/dL (ref 12.0–15.0)
MCH: 28.9 pg (ref 26.0–34.0)
MCHC: 32.8 g/dL (ref 30.0–36.0)
MCV: 88.3 fL (ref 78.0–100.0)
PLATELETS: 173 10*3/uL (ref 150–400)
RBC: 4.63 MIL/uL (ref 3.87–5.11)
RDW: 13.9 % (ref 11.5–15.5)
WBC: 9.9 10*3/uL (ref 4.0–10.5)

## 2016-08-23 LAB — COMPREHENSIVE METABOLIC PANEL
ALBUMIN: 3.5 g/dL (ref 3.5–5.0)
ALK PHOS: 68 U/L (ref 38–126)
ALT: 28 U/L (ref 14–54)
ANION GAP: 10 (ref 5–15)
AST: 34 U/L (ref 15–41)
BILIRUBIN TOTAL: 0.4 mg/dL (ref 0.3–1.2)
BUN: 7 mg/dL (ref 6–20)
CALCIUM: 9.1 mg/dL (ref 8.9–10.3)
CO2: 27 mmol/L (ref 22–32)
CREATININE: 0.79 mg/dL (ref 0.44–1.00)
Chloride: 102 mmol/L (ref 101–111)
GFR calc Af Amer: >60 mL/min (ref >60)
GFR calc non Af Amer: >60 mL/min (ref >60)
GLUCOSE: 100 mg/dL — AB (ref 65–99)
Potassium: 4.3 mmol/L (ref 3.5–5.1)
SODIUM: 139 mmol/L (ref 135–145)
TOTAL PROTEIN: 6.8 g/dL (ref 6.5–8.1)

## 2016-08-23 LAB — POC URINE PREG, ED: PREG TEST UR: NEGATIVE

## 2016-08-23 LAB — LIPASE, BLOOD: Lipase: 38 U/L (ref 11–51)

## 2016-08-23 MED ORDER — ACETAMINOPHEN 500 MG PO TABS
1000.0000 mg | ORAL_TABLET | Freq: Once | ORAL | Status: AC
  Administered 2016-08-23: 1000 mg via ORAL
  Filled 2016-08-23: qty 2

## 2016-08-23 NOTE — ED Provider Notes (Signed)
[facility]-EMERGENCY DEPT Provider Note   CSN: [id number] Arrival date & time: 08/23/16  2216  By signing my name below, I, [name], attest that this documentation has been prepared under the direction and in the presence of Tomasita Crumble, MD . Electronically Signed: [name], Scribe. 08/23/2016. 11:17 PM.  History   Chief Complaint Chief Complaint  Patient presents with  . Fall  . Hand Injury  . Abdominal Pain   The history is provided by the patient. No language interpreter was used.   HPI Comments: Joanna Deleon is a 31 y.o. female with PMHx of alcohol and drug abuse, who presents to the Emergency Department complaining of gradually worsening, "sharp," suprapubic abdominal pain that began ~3 days ago. Pt reports associated chills described as night sweats and mild dysuria that began ~1.5 weeks ago. She states she has taken Ibuprofen for her pain wth no relief. She denies any fever, vomiting, diarrhea, hematuria, abnormal vaginal bleeding or vaginal discharge.    Pt also complains of persistent, left hand pain s/p a fall that occurred last night. She states she was ambulating when she suddenly "tripped" and landed with her full body weight on her left side. She denies any other complaints.   Past Medical History:  Diagnosis Date  . Alcohol abuse   . Bipolar 1 disorder (HCC)   . Depression   . Drug abuse    Patient Active Problem List   Diagnosis Date Noted  . Preterm delivery 12/05/2015  . Bipolar 1 disorder (HCC) 05/14/2014  . Alcohol abuse with intoxication (HCC)   . Depressive disorder 11/05/2013  . Alcohol intoxication (HCC) 11/05/2013  . Substance induced mood disorder (HCC) 11/05/2013  . Alcohol dependence (HCC) 02/27/2013  . Major depression 02/27/2013  . Alcohol withdrawal (HCC) 02/26/2013  . Bipolar disorder, unspecified (HCC) 02/26/2013  . Polysubstance abuse 02/26/2013    Past Surgical History:  Procedure Laterality Date  . detox     ETOH    OB History    Gravida Para Term Preterm AB Living   SAB TAB Ectopic Multiple Live Births         0 1     Home Medications    Prior to Admission medications   Not on File    Family History No family history on file.  Social History Social History  Substance Use Topics  . Smoking status: Current Every Day Smoker    Packs/day: 1.00    Types: Cigarettes  . Smokeless tobacco: Never Used  . Alcohol use Yes     Comment: rarely   Allergies   Patient has no known allergies.  Review of Systems Review of Systems  Constitutional: Positive for chills. Negative for fever.  Gastrointestinal: Positive for abdominal pain. Negative for diarrhea and vomiting.  Genitourinary: Positive for dysuria. Negative for hematuria, vaginal bleeding and vaginal discharge.  Musculoskeletal: Positive for arthralgias.  All other systems reviewed and are negative.  Physical Exam Updated Vital Signs BP 124/88 (BP Location: Right Arm)   Pulse 97   Temp 97.8 F (36.6 C) (Oral)   Resp 20   LMP 08/12/2016   SpO2 100%   Physical Exam  Constitutional: She is oriented to person, place, and time. She appears well-developed and well-nourished. No distress.  HENT:  Head: Normocephalic and atraumatic.  Nose: Nose normal.  Mouth/Throat: Oropharynx is clear and moist. No oropharyngeal exudate.  Eyes: Conjunctivae and EOM are normal. Pupils are equal,  round, and reactive to light. No scleral icterus.  Neck: Normal range of motion. Neck supple. No JVD present. No tracheal deviation present. No thyromegaly present.  Cardiovascular: Normal rate, regular rhythm and normal heart sounds.  Exam reveals no gallop and no friction rub.   No murmur heard. Pulmonary/Chest: Effort normal and breath sounds normal. No respiratory distress. She has no wheezes. She exhibits no tenderness.  Abdominal: Soft. Bowel sounds are normal. She exhibits no distension and no mass. There is tenderness. There is no rebound and no guarding.   Suprapubic abdominal TTP  Musculoskeletal: She exhibits edema and tenderness.  Swelling of left hand, TTP with subcutaneous hematoma, normal strength and sensation to left wrist.  Lymphadenopathy:    She has no cervical adenopathy.  Neurological: She is alert and oriented to person, place, and time. No cranial nerve deficit. She exhibits normal muscle tone.  Skin: Skin is warm and dry. No rash noted. No erythema. No pallor.  Nursing note and vitals reviewed.  ED Treatments / Results  DIAGNOSTIC STUDIES:  Oxygen Saturation is 100% on RA, normal by my interpretation.    COORDINATION OF CARE:  11:07 PM Discussed treatment plan with pt at bedside and pt agreed to plan.  Labs (all labs ordered are listed, but only abnormal results are displayed) Labs Reviewed  WET PREP, GENITAL - Abnormal; Notable for the following:       Result Value   Clue Cells Wet Prep HPF POC PRESENT (*)    WBC, Wet Prep HPF POC MANY (*)    All other components within normal limits  COMPREHENSIVE METABOLIC PANEL - Abnormal; Notable for the following:    Glucose, Bld 100 (*)    All other components within normal limits  LIPASE, BLOOD  CBC  URINALYSIS, ROUTINE W REFLEX MICROSCOPIC  POC URINE PREG, ED  GC/CHLAMYDIA PROBE AMP ([place]) NOT AT [facility]    EKG  EKG Interpretation None       Radiology Dg Hand Complete Left  Result Date: 08/23/2016 CLINICAL DATA:  Patient fell last night. EXAM: LEFT HAND - COMPLETE 3+ VIEW COMPARISON:  None. FINDINGS: Three views study shows a transverse fracture through the diaphysis of the fourth metacarpal. There is about 1/2 shaft with radial displacement of the distal fragment relative to the proximal. Lateral film shows mild apex posterior angulation. IMPRESSION: Transverse fracture fourth metacarpal diaphysis. Electronically Signed   By: Kennith Center M.D.   On: 08/23/2016 23:10   Procedures Procedures (including critical care time)  Medications Ordered in  ED Medications  acetaminophen (TYLENOL) tablet 1,000 mg (1,000 mg Oral Given 08/23/16 2356)    Initial Impression / Assessment and Plan / ED Course  I have reviewed the triage vital signs and the nursing notes.  Pertinent labs & imaging results that were available during my care of the patient were reviewed by me and considered in my medical decision making (see chart for details).     Patient presents to the ED for lower abdominal pain and subjective fever.  She had some dysuria previously as well but UA is negative.  Will perform pelvic exam and possible TVUS as well for further evaluation.  Hand XR reveals a 4th MCP fracture. Will place sugar tong splint and refer to ortho.  WP shows clue cells. Will treat for BV.  Advised on PCP fu.  She appears well and in NAD.  No further tenderness on exam after tylenol. VS remain within her normal limits and she is safe  for Dc.   I personally performed the services described in this documentation, which was scribed in my presence. The recorded information has been reviewed and is accurate.     Final Clinical Impressions(s) / ED Diagnoses   Final diagnoses:  Closed displaced fracture of shaft of fourth metacarpal bone of left hand, initial encounter  Lower abdominal pain    New Prescriptions New Prescriptions   No medications on file     Tomasita Crumble, MD 08/24/16 [phone]

## 2016-08-23 NOTE — ED Triage Notes (Signed)
Pt states she start to have left lower abdominal sharp pains; pt states pain at 9/10; pt also states she wants left hand evaluated from fall yesterday; pt a&ox 4 on arrival.

## 2016-08-24 LAB — WET PREP, GENITAL
TRICH WET PREP: NONE SEEN
YEAST WET PREP: NONE SEEN

## 2016-08-24 LAB — GC/CHLAMYDIA PROBE AMP (~~LOC~~) NOT AT ARMC
Chlamydia: NEGATIVE
Neisseria Gonorrhea: NEGATIVE

## 2016-08-24 MED ORDER — METRONIDAZOLE 500 MG PO TABS: 500.0000 mg | ORAL_TABLET | Freq: Two times a day (BID) | ORAL | 0 refills | Status: AC

## 2016-08-24 NOTE — ED Notes (Signed)
Ortho tech at bedside applying splint. 

## 2016-08-24 NOTE — ED Notes (Signed)
At bedside assisting EDP with pelvic exam.

## 2016-08-24 NOTE — Progress Notes (Signed)
Orthopedic Tech Progress Note Patient Details:  RAYLEI LOSURDO Jul 05, 1985 [id number]  Ortho Devices Type of Ortho Device: Ulna gutter splint, Arm sling Ortho Device/Splint Location: lue Ortho Device/Splint Interventions: Ordered, Application   Trinna Post 08/24/2016, 12:24 AM

## 2018-01-27 IMAGING — CR DG HAND COMPLETE 3+V*L*
3 series · 3 of 3 positions shown · non-contrast
Comparison: None.

CLINICAL DATA: Patient fell last night.

EXAM:
LEFT HAND - COMPLETE 3+ VIEW

[hand pa]
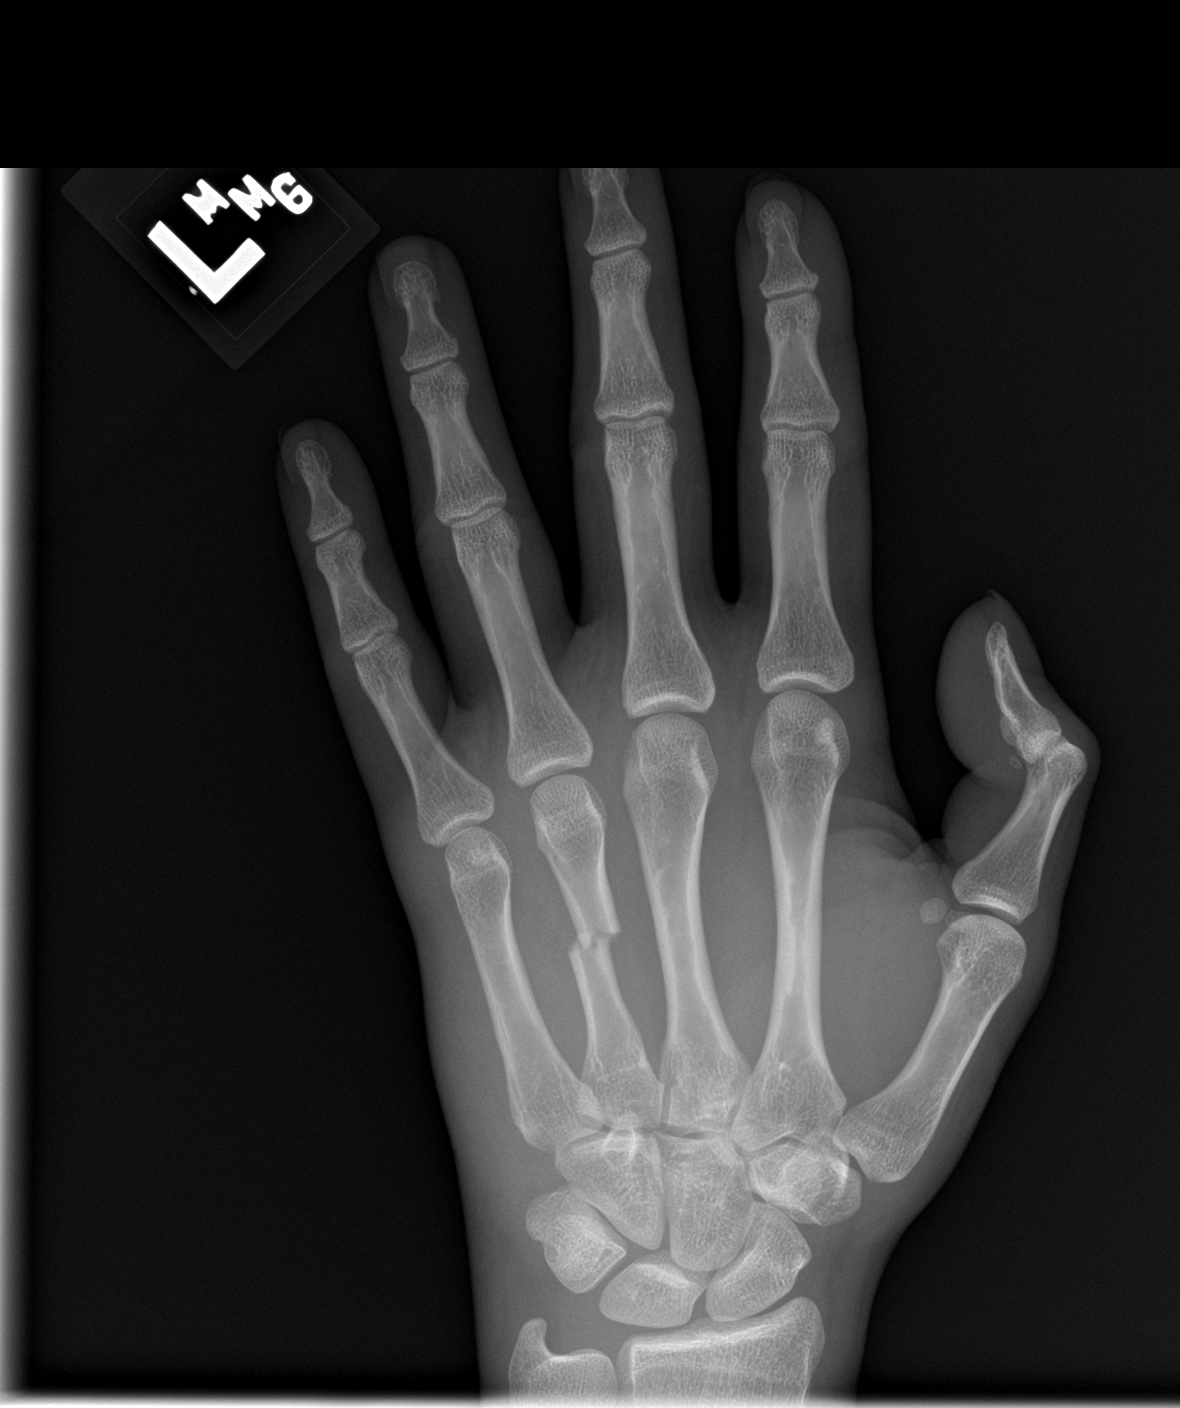

[hand obl]
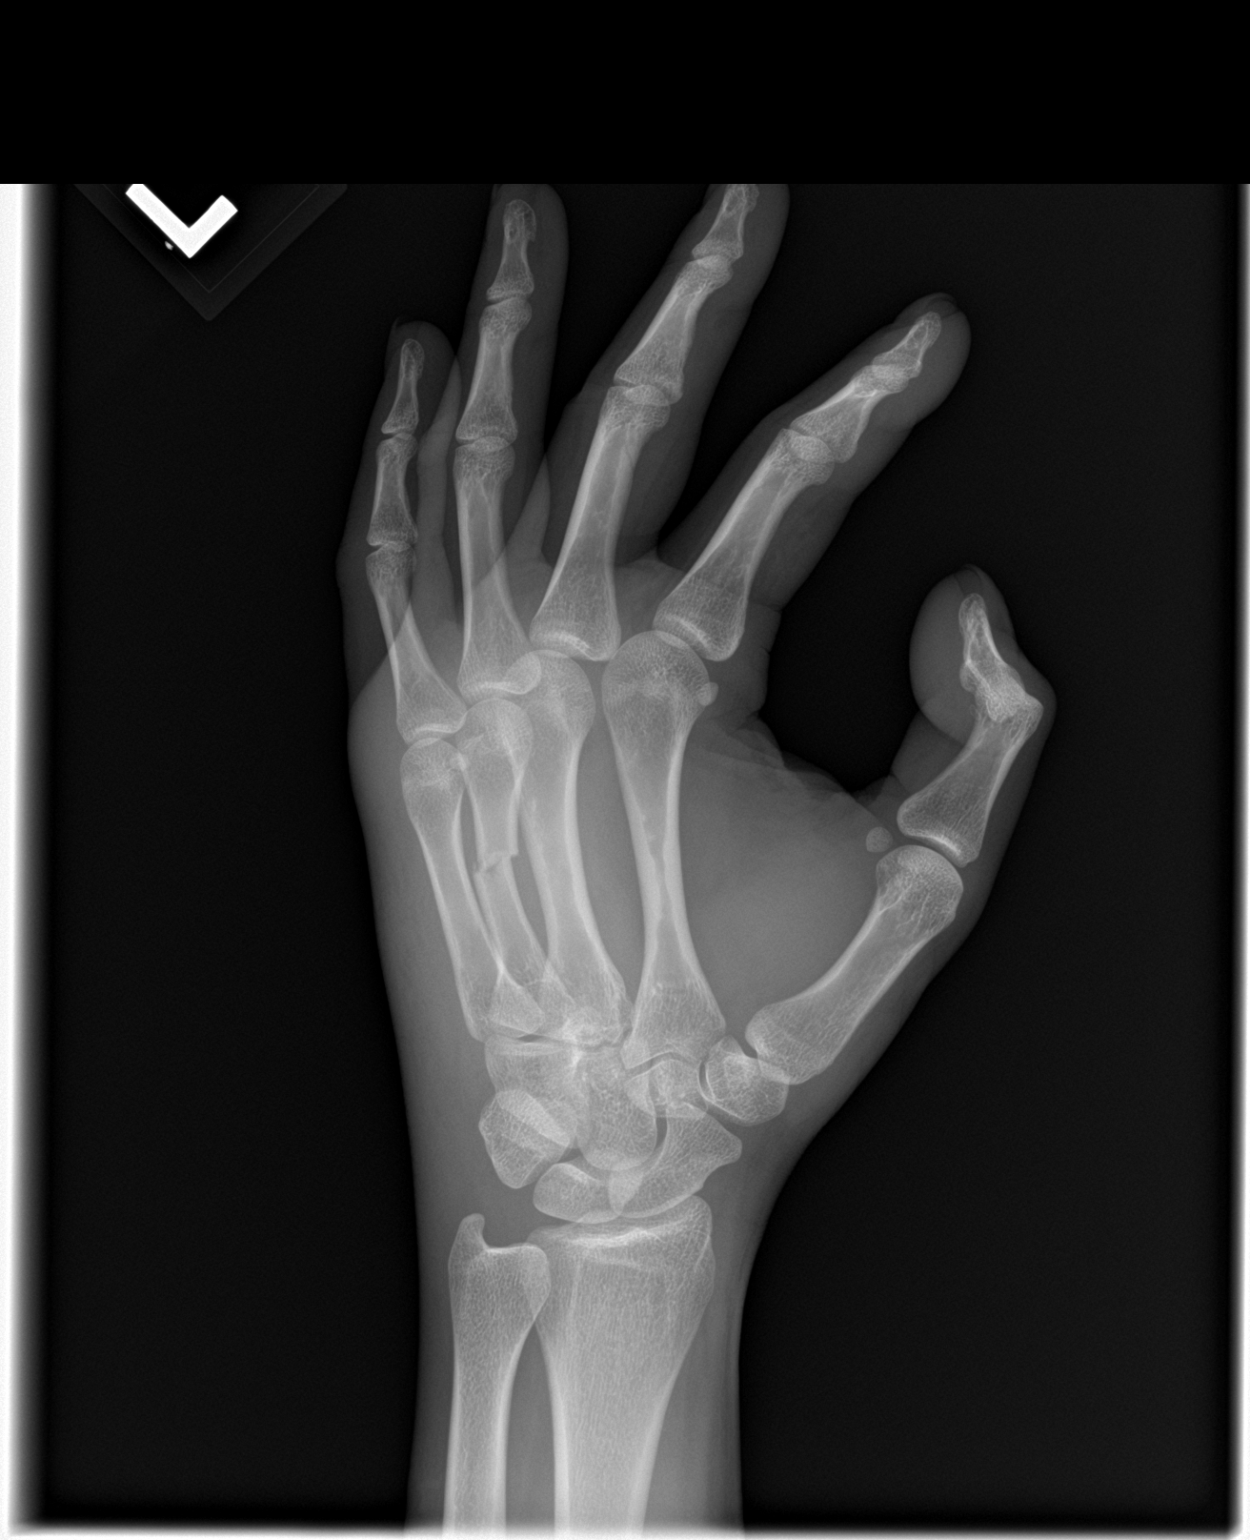

[hand lat]
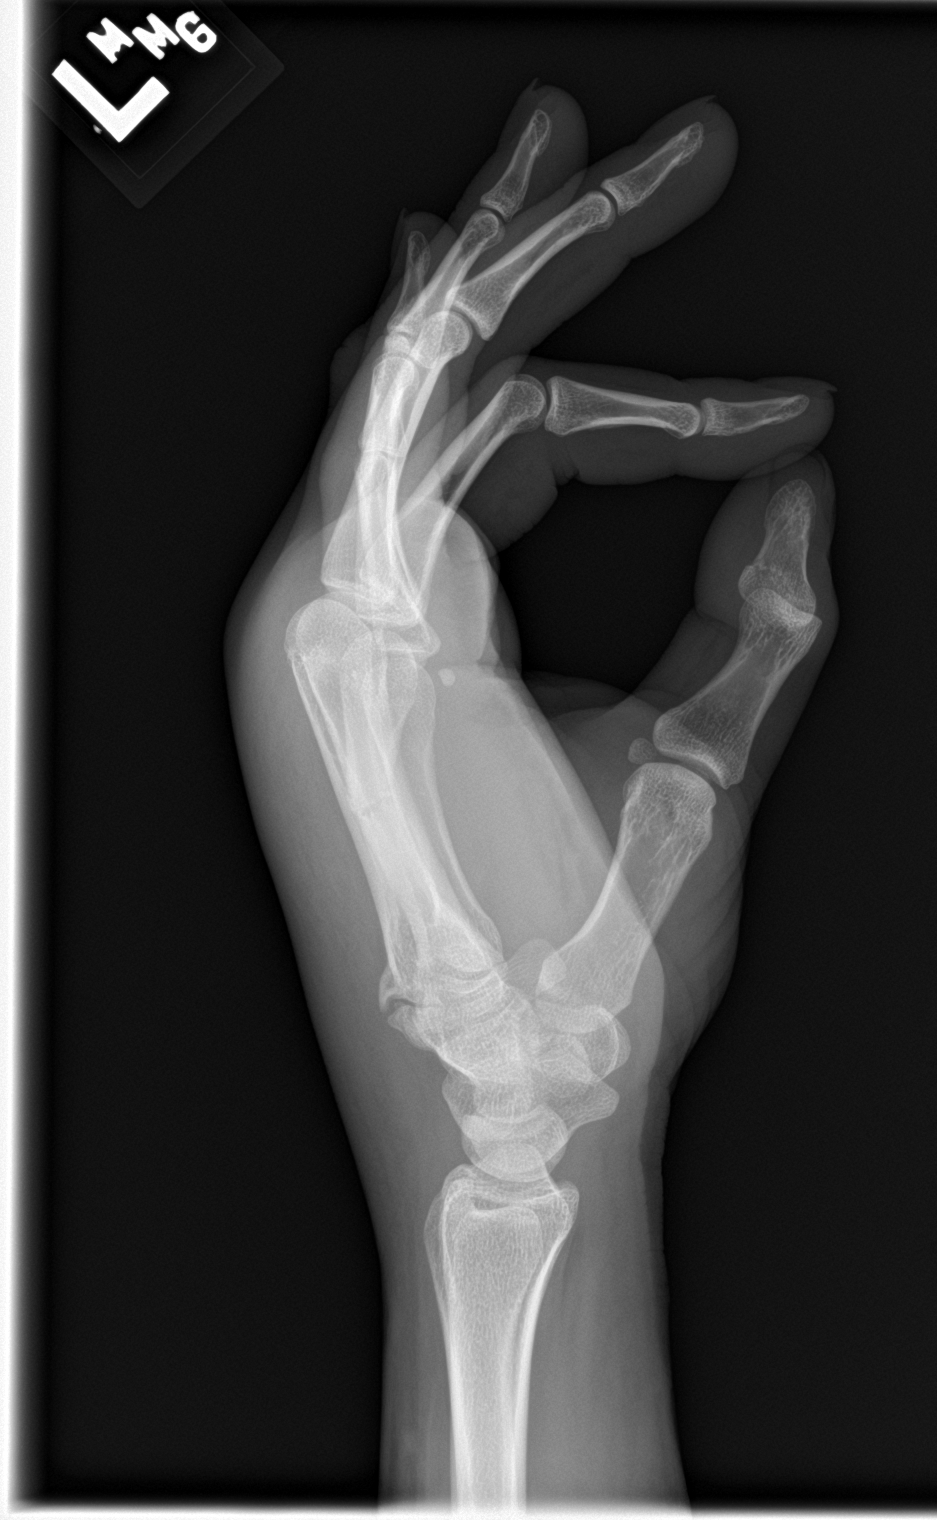

[3 of 3 positions shown; findings below may reference images not displayed]

FINDINGS: Three views study shows a transverse fracture through the diaphysis
of the fourth metacarpal. There is about [DATE] shaft with radial
displacement of the distal fragment relative to the proximal.
Lateral film shows mild apex posterior angulation.
IMPRESSION: Transverse fracture fourth metacarpal diaphysis.
